# Patient Record
Sex: Male | Born: 1960 | Race: White | Hispanic: No | Marital: Married | Smoking: Never smoker
Health system: Southern US, Community
[De-identification: ages and names within clinical notes are randomized; demographics above are authoritative.]

## PROBLEM LIST (undated history)

## (undated) DIAGNOSIS — I10 Essential (primary) hypertension: Secondary | ICD-10-CM

## (undated) DIAGNOSIS — Z8489 Family history of other specified conditions: Secondary | ICD-10-CM

## (undated) DIAGNOSIS — G4730 Sleep apnea, unspecified: Secondary | ICD-10-CM

## (undated) DIAGNOSIS — F419 Anxiety disorder, unspecified: Secondary | ICD-10-CM

## (undated) DIAGNOSIS — M4316 Spondylolisthesis, lumbar region: Secondary | ICD-10-CM

## (undated) HISTORY — PX: TONSILLECTOMY: SUR1361

## (undated) HISTORY — PX: APPENDECTOMY: SHX54

## (undated) HISTORY — PX: HERNIA REPAIR: SHX51

---

## 2006-07-03 ENCOUNTER — Ambulatory Visit: Payer: Self-pay | Admitting: Otolaryngology

## 2010-09-21 ENCOUNTER — Ambulatory Visit: Payer: Self-pay | Admitting: Gastroenterology

## 2010-09-23 LAB — PATHOLOGY REPORT

## 2013-02-23 ENCOUNTER — Ambulatory Visit: Payer: Self-pay | Admitting: Internal Medicine

## 2013-04-07 ENCOUNTER — Ambulatory Visit: Payer: Self-pay | Admitting: Internal Medicine

## 2013-08-24 IMAGING — US US ABDOMEN LIMITED SLG ORGAN/ASCITES
1 series · 14 of 25 positions shown · non-contrast
Comparison: none

REASON FOR EXAM: liver  elevated LFTs
COMMENTS:

PROCEDURE:     [facility] - GREEN[facility]ALD ABDOMEN LTD 1 ORGAN OR QUAD  - April 07, 2013  [DATE]
RESULT:     History: Elevated liver function tests.
Comparison Study: No prior.

[Series 1: us abdomen limited slg organ/ascites · 0.30mm/px · 14 of 71 slices shown]
[im 1/71]
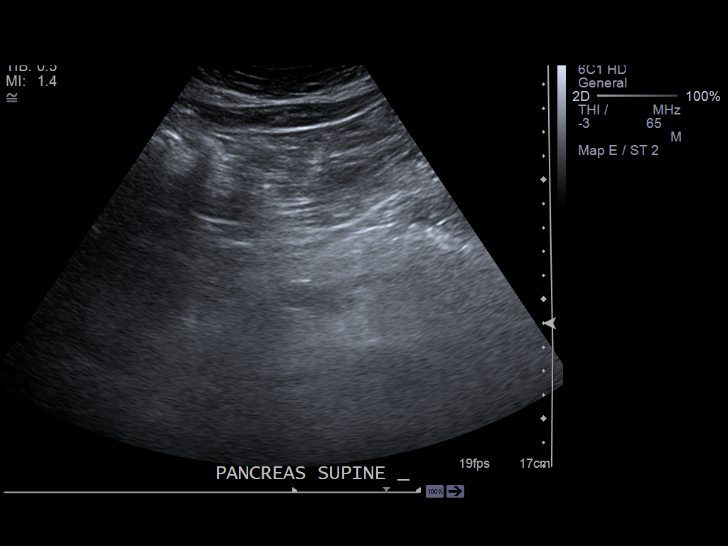
[im 6/71]
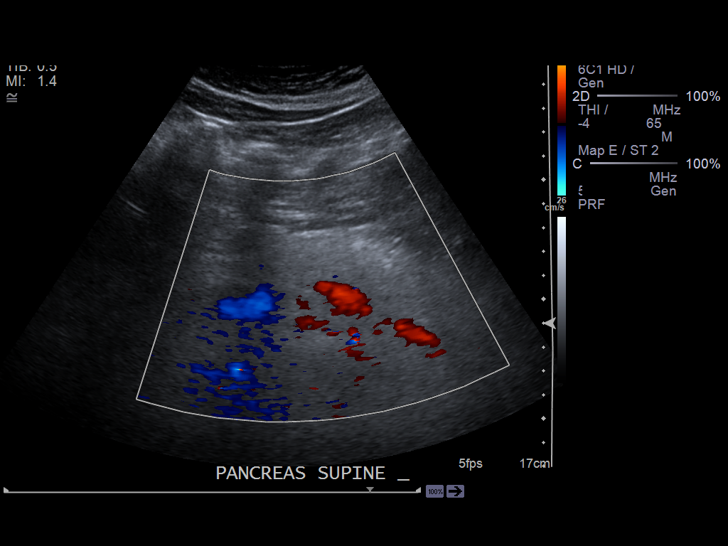
[im 12/71]
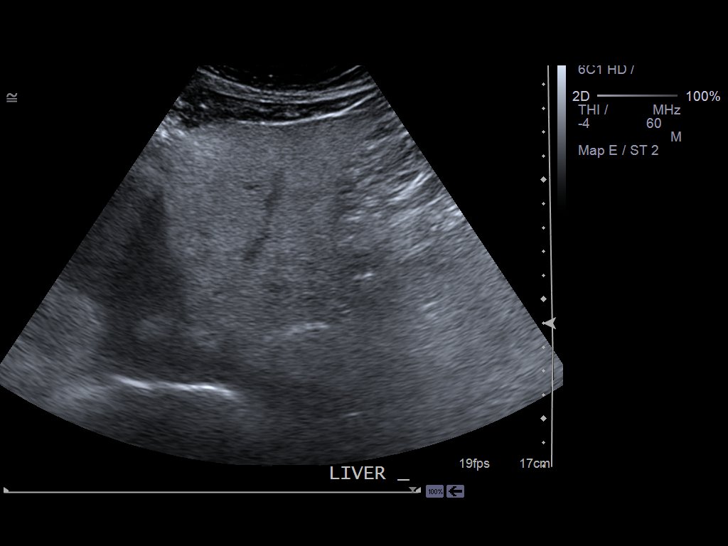
[im 18/71]
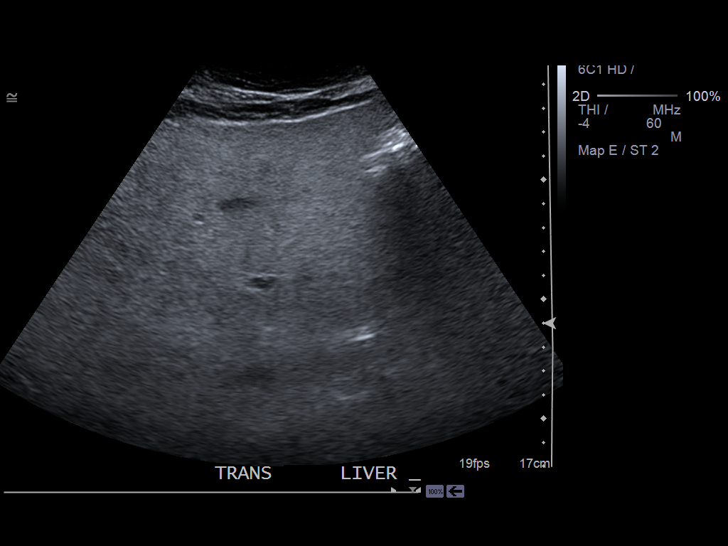
[im 24/71]
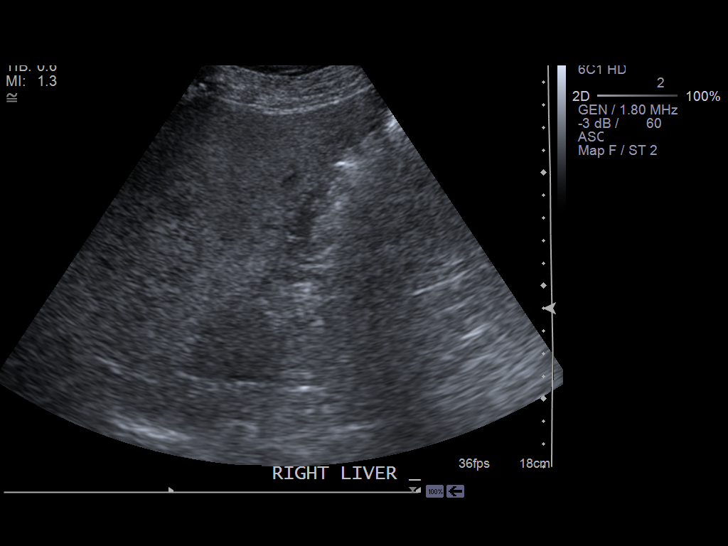
[im 27/71]
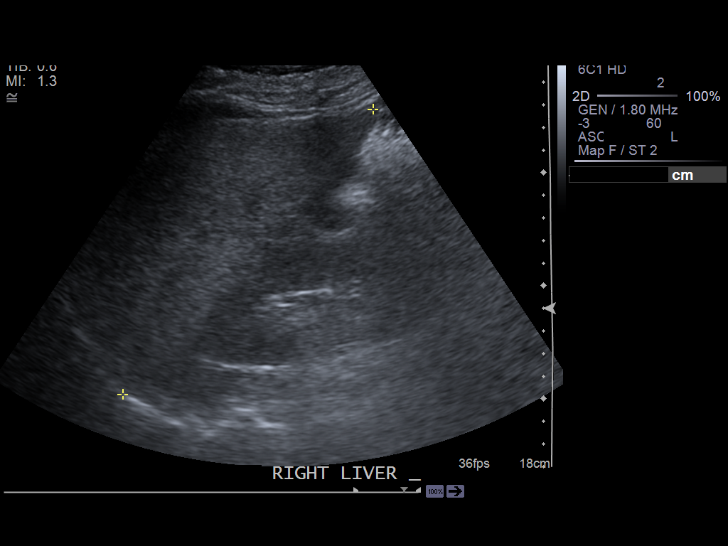
[im 33/71]
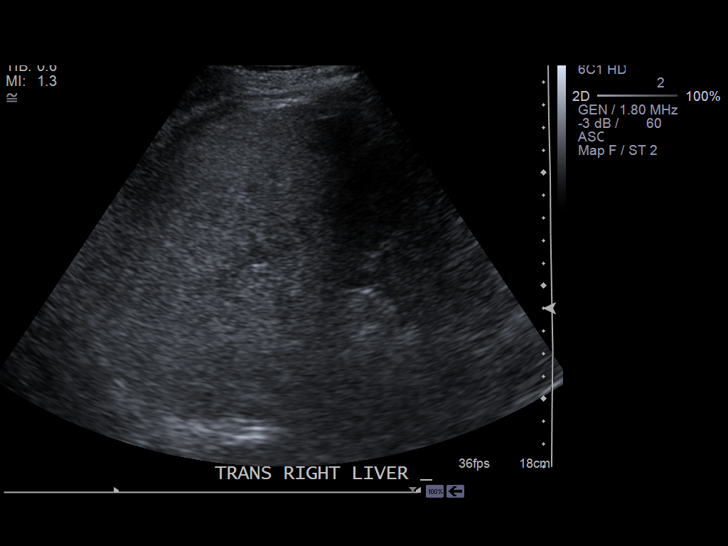
[im 38/71]
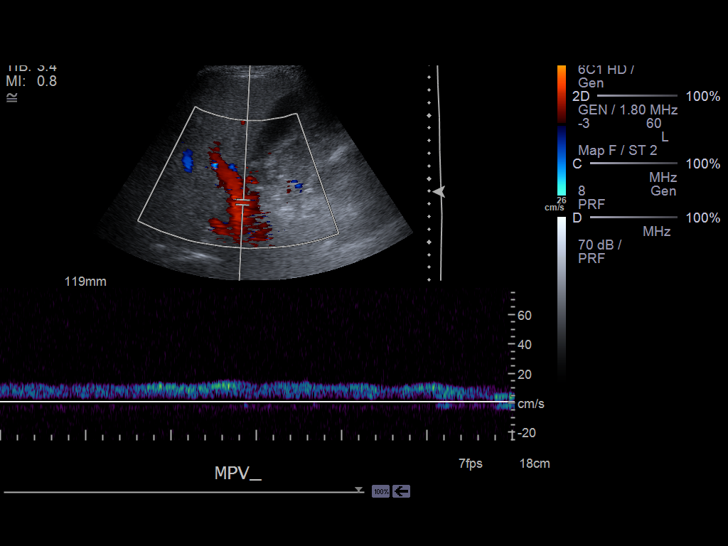
[im 44/71]
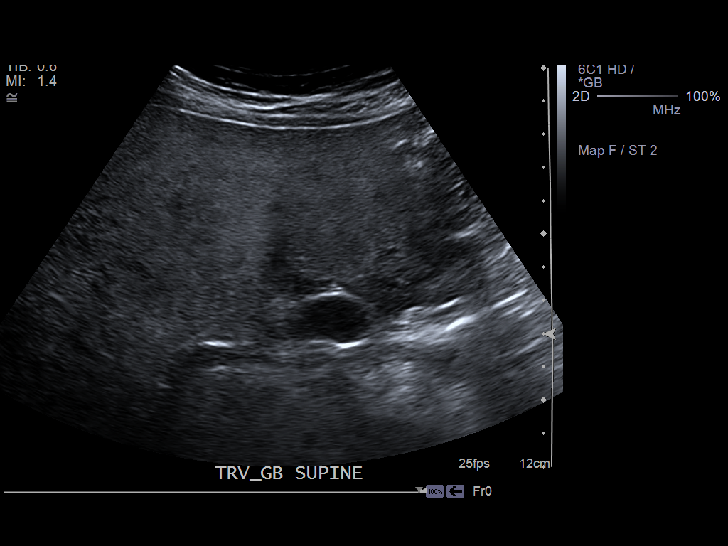
[im 47/71]
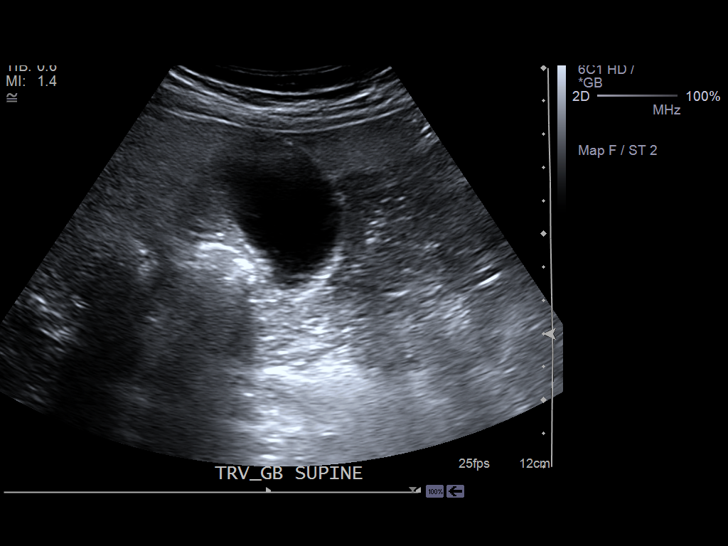
[im 53/71]
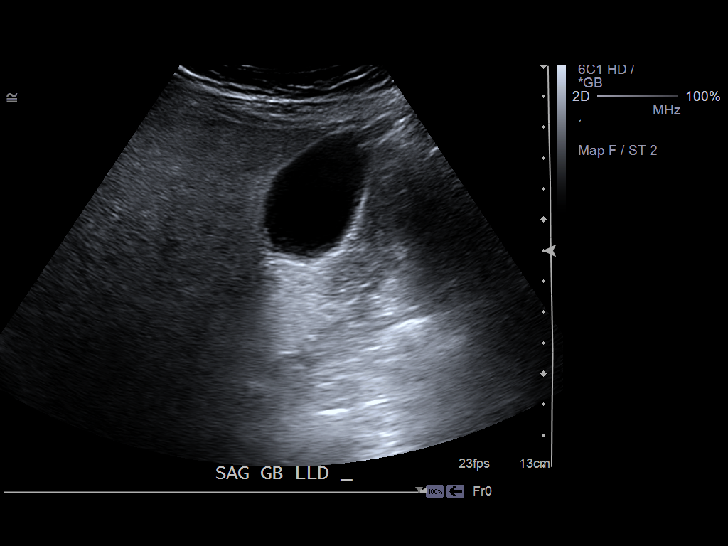
[im 59/71]
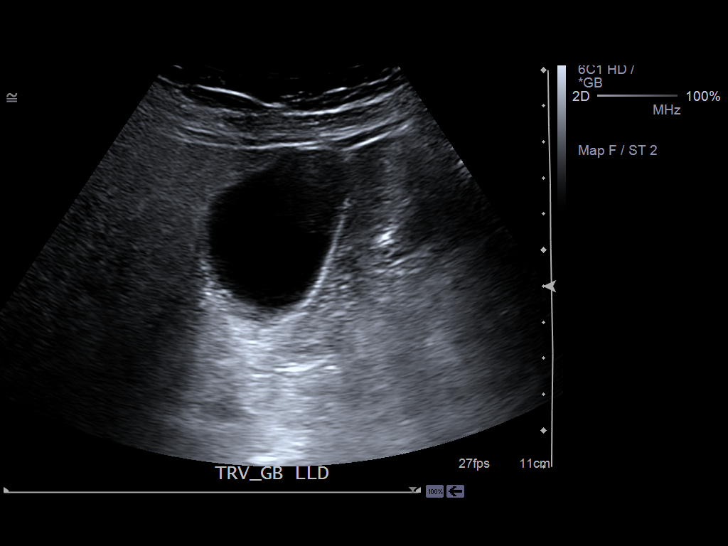
[im 65/71]
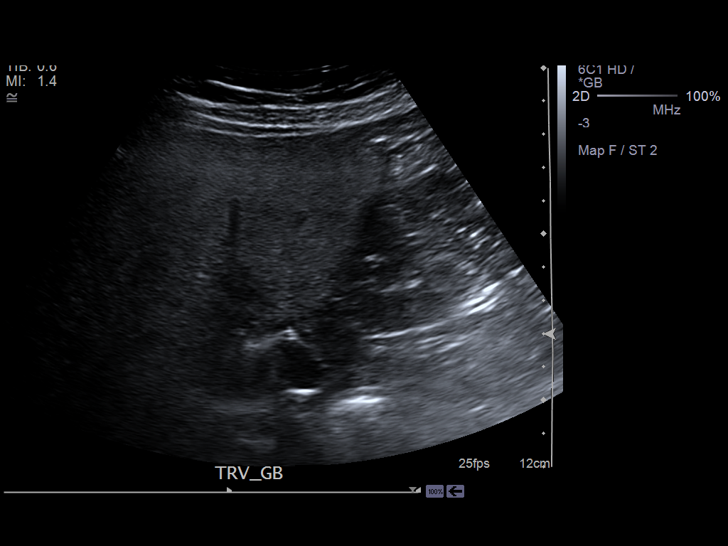
[im 71/71]
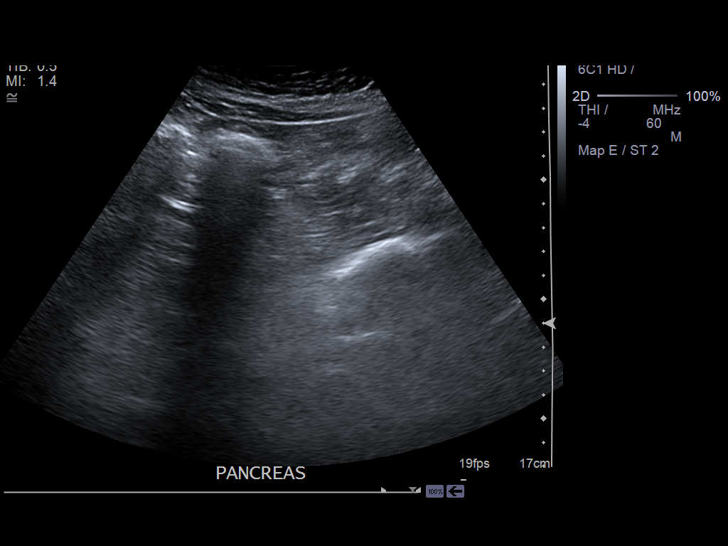

[14 of 25 positions shown; findings below may reference images not displayed]

FINDINGS: Fatty infiltration of the liver present. Portal vein patent.
Pancreas is poorly visualized. Gallbladder normal. Negative Murphy's sign.
Gallbladder wall thickness 1.7 mm, common bile duct caliber 3.2 mm.
IMPRESSION: Fatty infiltration of the liver. No significant biliary
disease.

## 2014-02-02 DIAGNOSIS — G4733 Obstructive sleep apnea (adult) (pediatric): Secondary | ICD-10-CM | POA: Diagnosis present

## 2014-02-02 DIAGNOSIS — M519 Unspecified thoracic, thoracolumbar and lumbosacral intervertebral disc disorder: Secondary | ICD-10-CM | POA: Insufficient documentation

## 2016-06-19 DIAGNOSIS — N401 Enlarged prostate with lower urinary tract symptoms: Secondary | ICD-10-CM | POA: Insufficient documentation

## 2017-05-09 DIAGNOSIS — Z0000 Encounter for general adult medical examination without abnormal findings: Secondary | ICD-10-CM | POA: Diagnosis not present

## 2017-05-16 DIAGNOSIS — Z0000 Encounter for general adult medical examination without abnormal findings: Secondary | ICD-10-CM | POA: Diagnosis not present

## 2017-05-16 DIAGNOSIS — E782 Mixed hyperlipidemia: Secondary | ICD-10-CM | POA: Insufficient documentation

## 2017-06-03 DIAGNOSIS — M5416 Radiculopathy, lumbar region: Secondary | ICD-10-CM | POA: Diagnosis not present

## 2017-06-03 DIAGNOSIS — M48062 Spinal stenosis, lumbar region with neurogenic claudication: Secondary | ICD-10-CM | POA: Diagnosis not present

## 2017-12-04 DIAGNOSIS — N410 Acute prostatitis: Secondary | ICD-10-CM | POA: Diagnosis not present

## 2017-12-04 DIAGNOSIS — R102 Pelvic and perineal pain: Secondary | ICD-10-CM | POA: Diagnosis not present

## 2018-01-13 DIAGNOSIS — M7989 Other specified soft tissue disorders: Secondary | ICD-10-CM | POA: Diagnosis not present

## 2018-01-13 DIAGNOSIS — W1842XA Slipping, tripping and stumbling without falling due to stepping into hole or opening, initial encounter: Secondary | ICD-10-CM | POA: Diagnosis not present

## 2018-01-13 DIAGNOSIS — M25571 Pain in right ankle and joints of right foot: Secondary | ICD-10-CM | POA: Diagnosis not present

## 2018-01-13 DIAGNOSIS — S99911A Unspecified injury of right ankle, initial encounter: Secondary | ICD-10-CM | POA: Diagnosis not present

## 2018-05-15 DIAGNOSIS — Z0000 Encounter for general adult medical examination without abnormal findings: Secondary | ICD-10-CM | POA: Diagnosis not present

## 2018-05-23 DIAGNOSIS — Z0000 Encounter for general adult medical examination without abnormal findings: Secondary | ICD-10-CM | POA: Diagnosis not present

## 2019-08-27 ENCOUNTER — Ambulatory Visit: Payer: 59 | Attending: Internal Medicine

## 2019-08-27 ENCOUNTER — Other Ambulatory Visit: Payer: Self-pay

## 2019-08-27 DIAGNOSIS — Z20822 Contact with and (suspected) exposure to covid-19: Secondary | ICD-10-CM

## 2019-08-28 LAB — NOVEL CORONAVIRUS, NAA: SARS-CoV-2, NAA: NOT DETECTED

## 2020-05-25 ENCOUNTER — Other Ambulatory Visit: Payer: Self-pay | Admitting: Internal Medicine

## 2020-05-25 DIAGNOSIS — R1084 Generalized abdominal pain: Secondary | ICD-10-CM

## 2020-05-26 ENCOUNTER — Other Ambulatory Visit: Payer: Self-pay

## 2020-05-26 ENCOUNTER — Ambulatory Visit
Admission: RE | Admit: 2020-05-26 | Discharge: 2020-05-26 | Disposition: A | Discharge status: Home / Self Care | Payer: 59 | Source: Ambulatory Visit | Attending: Internal Medicine | Admitting: Internal Medicine

## 2020-05-26 DIAGNOSIS — R1084 Generalized abdominal pain: Secondary | ICD-10-CM | POA: Diagnosis present

## 2020-10-12 IMAGING — CT CT ABD-PELV W/O CM
2 of 4 series · 16 of 46 positions shown, 18 images · non-contrast
Comparison: None.

CLINICAL DATA: Acute generalized abdominal pain, diarrhea.

EXAM:
CT ABDOMEN AND PELVIS WITHOUT CONTRAST
TECHNIQUE: Multidetector CT imaging of the abdomen and pelvis was performed
following the standard protocol without IV contrast.

[Series 2: routine abd/pel wo · axial · 0.84mm/px · z∈[-458,-8]mm · 13 of 100 slices shown, 15 images]
[im 5/100  soft-tissue]
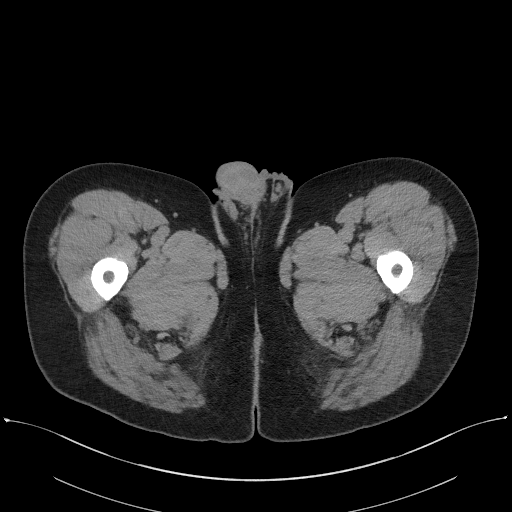
[im 5/100  bone]
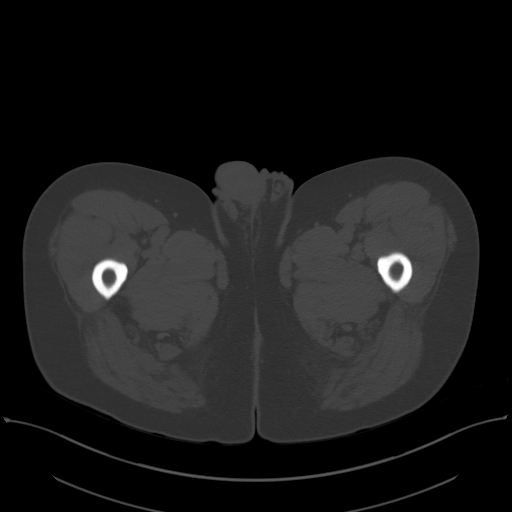
[im 13/100  soft-tissue]
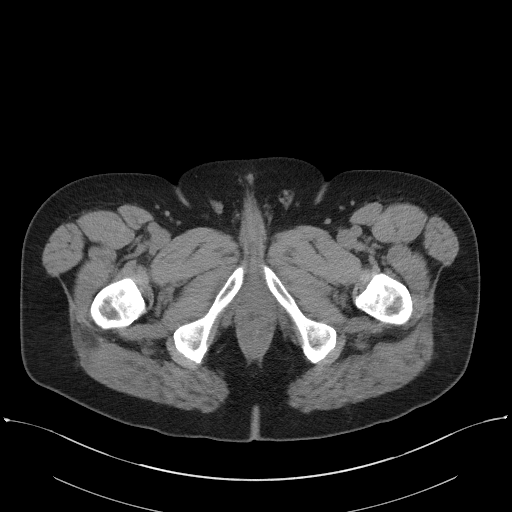
[im 21/100  soft-tissue]
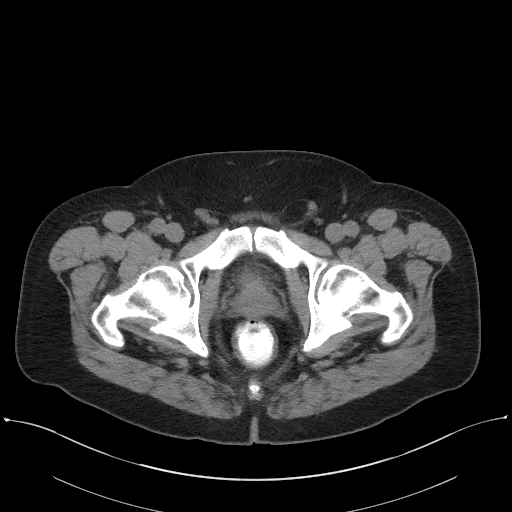
[im 29/100  soft-tissue]
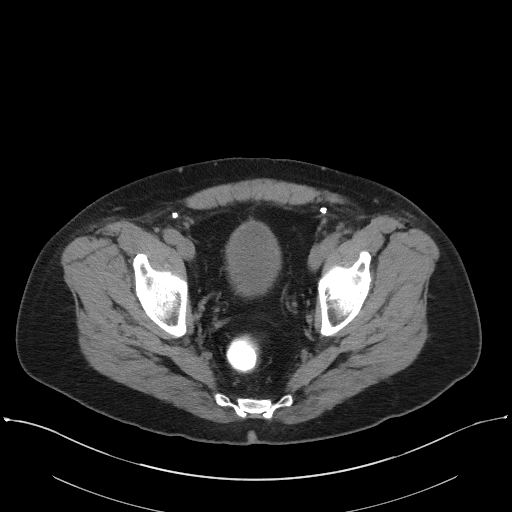
[im 34/100  soft-tissue]
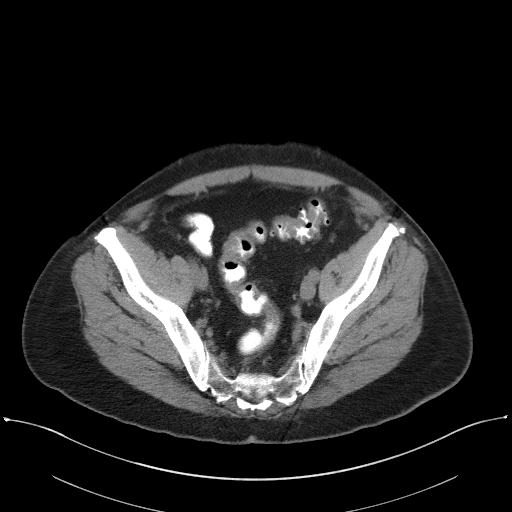
[im 42/100  soft-tissue]
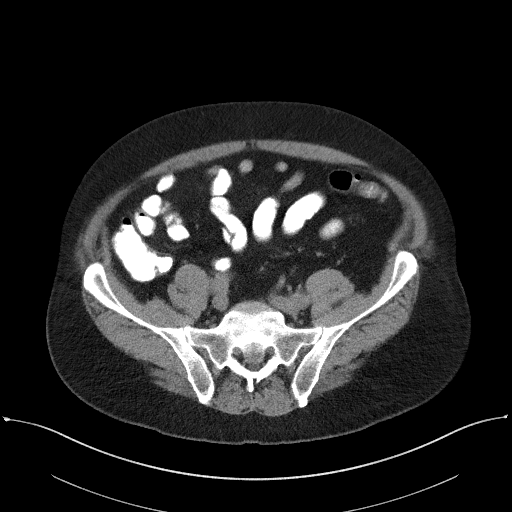
[im 50/100  soft-tissue]
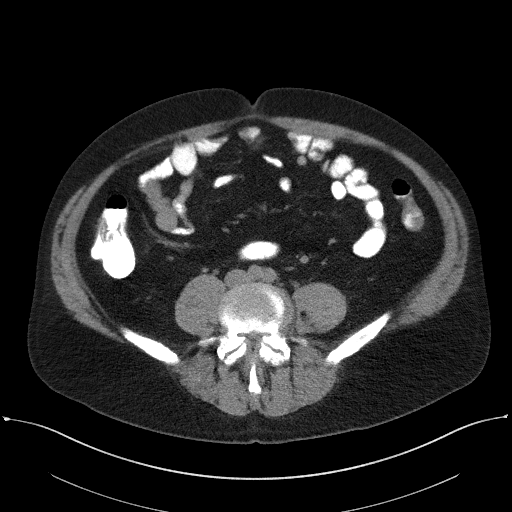
[im 58/100  soft-tissue]
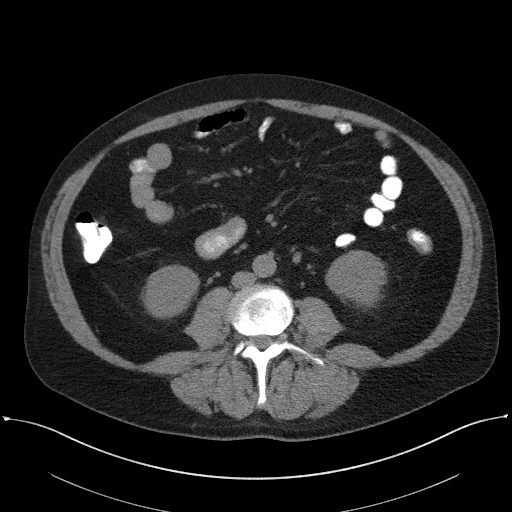
[im 67/100  soft-tissue]
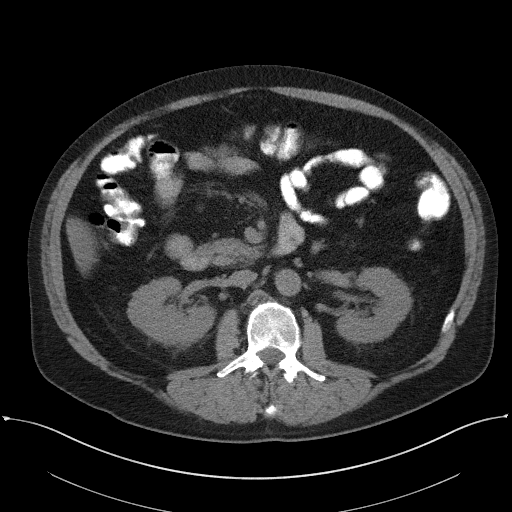
[im 67/100  bone]
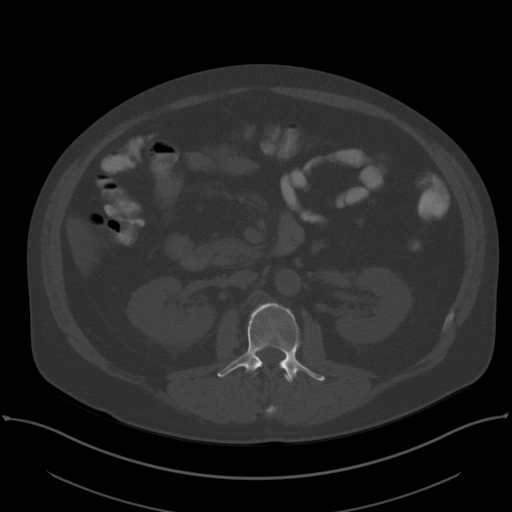
[im 71/100  soft-tissue]
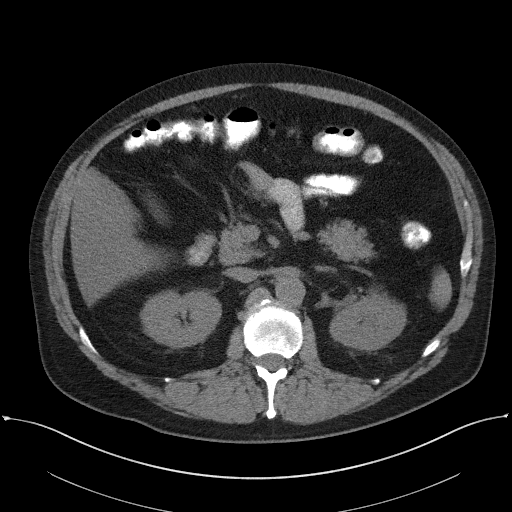
[im 79/100  soft-tissue]
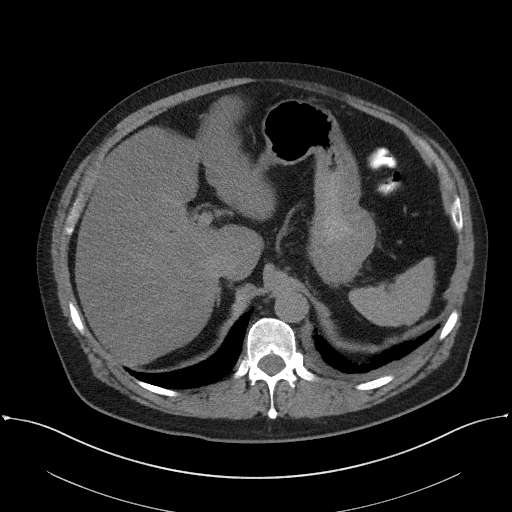
[im 87/100  soft-tissue]
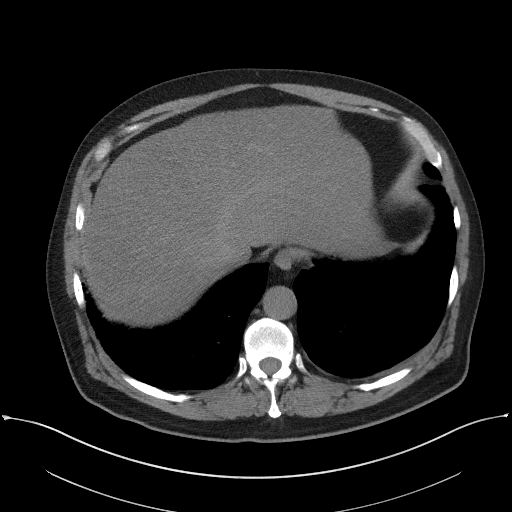
[im 95/100  soft-tissue]
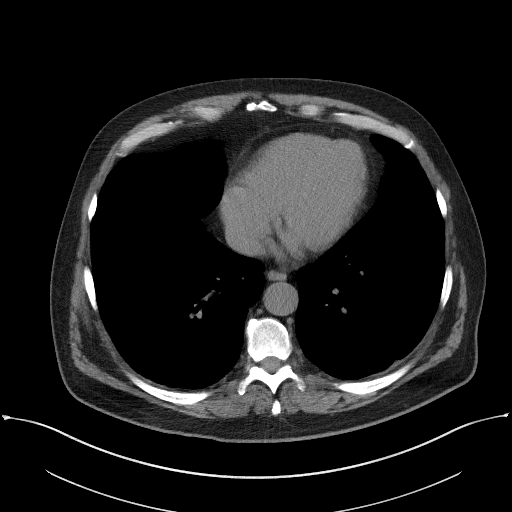

[Series 5: coronal st · coronal · 0.79mm/px · 3 of 108 slices shown]
[im 36/108  soft-tissue]
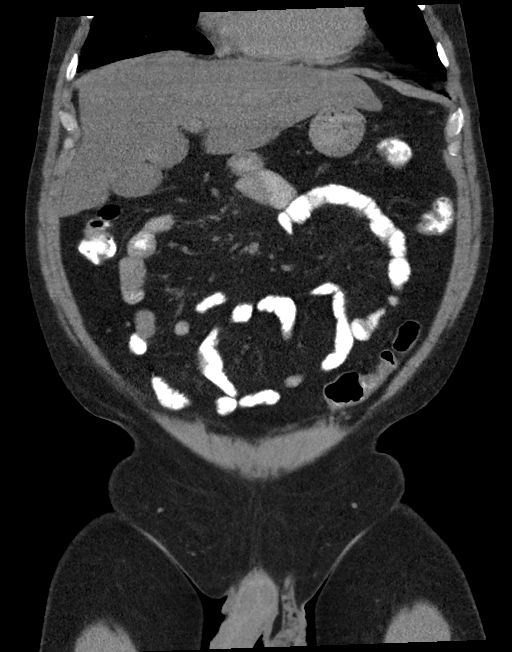
[im 48/108  soft-tissue]
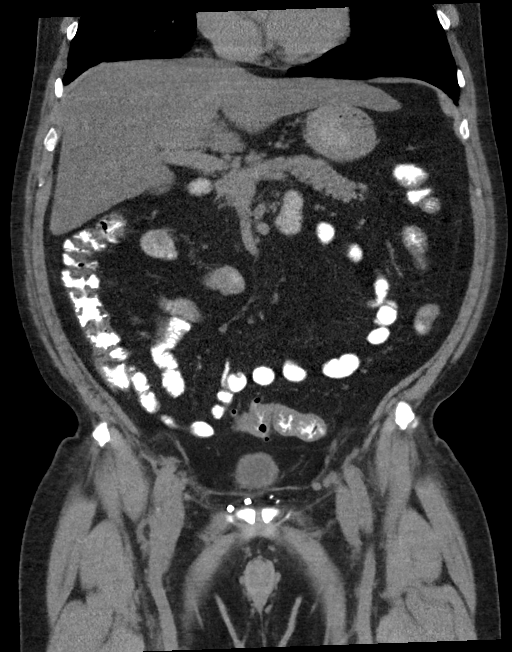
[im 60/108  soft-tissue]
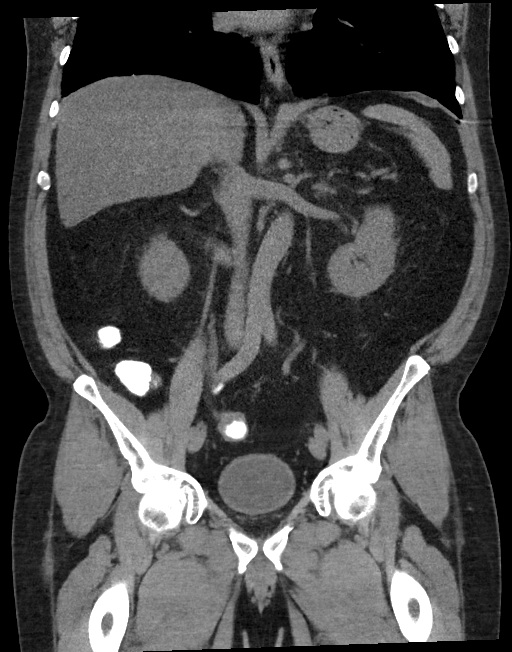

[16 of 46 positions shown; findings below may reference images not displayed]

FINDINGS: Lower chest: No acute abnormality.

Hepatobiliary: No gallstones or biliary dilatation is noted.
Probable hepatic steatosis.

Pancreas: Unremarkable. No pancreatic ductal dilatation or
surrounding inflammatory changes.

Spleen: Normal in size without focal abnormality.

Adrenals/Urinary Tract: Adrenal glands are unremarkable. Kidneys are
normal, without renal calculi, focal lesion, or hydronephrosis.
Bladder is unremarkable.

Stomach/Bowel: The stomach appears normal. There is no evidence of
bowel obstruction. Mild sigmoid diverticulosis is noted without
inflammation. Status post appendectomy.

Vascular/Lymphatic: No significant vascular findings are present. No
enlarged abdominal or pelvic lymph nodes.

Reproductive: Prostate is unremarkable.

Other: No abdominal wall hernia or abnormality. No abdominopelvic
ascites.

Musculoskeletal: No acute or significant osseous findings.
IMPRESSION: 1. Probable hepatic steatosis.
2. Mild sigmoid diverticulosis without inflammation.
3. No acute abnormality seen in the abdomen or pelvis.

## 2022-06-01 DIAGNOSIS — D369 Benign neoplasm, unspecified site: Secondary | ICD-10-CM | POA: Insufficient documentation

## 2022-09-07 ENCOUNTER — Other Ambulatory Visit: Payer: Self-pay | Admitting: Internal Medicine

## 2022-09-07 DIAGNOSIS — M4807 Spinal stenosis, lumbosacral region: Secondary | ICD-10-CM

## 2022-09-28 ENCOUNTER — Other Ambulatory Visit: Payer: 59

## 2022-10-05 ENCOUNTER — Ambulatory Visit
Admission: RE | Admit: 2022-10-05 | Discharge: 2022-10-05 | Disposition: A | Discharge status: Home / Self Care | Payer: 59 | Source: Ambulatory Visit | Attending: Internal Medicine | Admitting: Internal Medicine

## 2022-10-05 DIAGNOSIS — M4807 Spinal stenosis, lumbosacral region: Secondary | ICD-10-CM

## 2022-10-24 ENCOUNTER — Other Ambulatory Visit: Payer: Self-pay | Admitting: Neurosurgery

## 2022-10-29 ENCOUNTER — Other Ambulatory Visit: Payer: Self-pay | Admitting: Neurosurgery

## 2022-11-12 ENCOUNTER — Encounter (HOSPITAL_COMMUNITY): Payer: Self-pay

## 2022-11-12 NOTE — Pre-Procedure Instructions (Signed)
Surgical Instructions    Your procedure is scheduled on Wednesday, March 13th.  Report to [facility] Main Entrance "A" at 06:30 A.M., then check in with the Admitting office.  Call this number if you have problems the morning of surgery:  [phone]  If you have any questions prior to your surgery date call [phone]: Open Monday-Friday 8am-4pm If you experience any cold or flu symptoms such as cough, fever, chills, shortness of breath, etc. between now and your scheduled surgery, please notify us at the above number.     Remember:  Do not eat or drink after midnight the night before your surgery     Take these medicines the morning of surgery with A SIP OF WATER  fluticasone (FLONASE)  PARoxetine (PAXIL)    If needed: ALPRAZolam Duanne Moron)  gabapentin (NEURONTIN)     As of today, STOP taking any Aspirin (unless otherwise instructed by your surgeon) Aleve, Naproxen, Ibuprofen, Motrin, Advil, Goody's, BC's, all herbal medications, fish oil, and all vitamins.                     Do NOT Smoke (Tobacco/Vaping) for 24 hours prior to your procedure.  If you use a CPAP at night, you may bring your mask/headgear for your overnight stay.   Contacts, glasses, piercing's, hearing aid's, dentures or partials may not be worn into surgery, please bring cases for these belongings.    For patients admitted to the hospital, discharge time will be determined by your treatment team.   Patients discharged the day of surgery will not be allowed to drive home, and someone needs to stay with them for 24 hours.  SURGICAL WAITING ROOM VISITATION Patients having surgery or a procedure may have no more than 2 support people in the waiting area - these visitors may rotate.   Children under the age of 88 must have an adult with them who is not the patient. If the patient needs to stay at the hospital during part of their recovery, the visitor guidelines for inpatient rooms apply. Pre-op nurse will  coordinate an appropriate time for 1 support person to accompany patient in pre-op.  This support person may not rotate.   Please refer to the [facility] website for the visitor guidelines for Inpatients (after your surgery is over and you are in a regular room).    Special instructions:   [place]- Preparing For Surgery  Before surgery, you can play an important role. Because skin is not sterile, your skin needs to be as free of germs as possible. You can reduce the number of germs on your skin by washing with CHG (chlorahexidine gluconate) Soap before surgery.  CHG is an antiseptic cleaner which kills germs and bonds with the skin to continue killing germs even after washing.    Oral Hygiene is also important to reduce your risk of infection.  Remember - BRUSH YOUR TEETH THE MORNING OF SURGERY WITH YOUR REGULAR TOOTHPASTE  Please do not use if you have an allergy to CHG or antibacterial soaps. If your skin becomes reddened/irritated stop using the CHG.  Do not shave (including legs and underarms) for at least 48 hours prior to first CHG shower. It is OK to shave your face.  Please follow these instructions carefully.   Shower the NIGHT BEFORE SURGERY and the MORNING OF SURGERY  If you chose to wash your hair, wash your hair first as usual with your normal shampoo.  After you shampoo, rinse your  hair and body thoroughly to remove the shampoo.  Use CHG Soap as you would any other liquid soap. You can apply CHG directly to the skin and wash gently with a scrungie or a clean washcloth.   Apply the CHG Soap to your body ONLY FROM THE NECK DOWN.  Do not use on open wounds or open sores. Avoid contact with your eyes, ears, mouth and genitals (private parts). Wash Face and genitals (private parts)  with your normal soap.   Wash thoroughly, paying special attention to the area where your surgery will be performed.  Thoroughly rinse your body with warm water from the neck down.  DO NOT  shower/wash with your normal soap after using and rinsing off the CHG Soap.  Pat yourself dry with a CLEAN TOWEL.  Wear CLEAN PAJAMAS to bed the night before surgery  Place CLEAN SHEETS on your bed the night before your surgery  DO NOT SLEEP WITH PETS.   Day of Surgery: Take a shower with CHG soap. Do not wear jewelry  Do not wear lotions, powders, colognes, or deodorant. Men may shave face and neck. Do not bring valuables to the hospital. [facility] is not responsible for any belongings or valuables.  Wear Clean/Comfortable clothing the morning of surgery Remember to brush your teeth WITH YOUR REGULAR TOOTHPASTE.   Please read over the following fact sheets that you were given.    If you received a COVID test during your pre-op visit  it is requested that you wear a mask when out in public, stay away from anyone that may not be feeling well and notify your surgeon if you develop symptoms. If you have been in contact with anyone that has tested positive in the last 10 days please notify you surgeon.

## 2022-11-13 ENCOUNTER — Encounter (HOSPITAL_COMMUNITY): Payer: Self-pay

## 2022-11-13 ENCOUNTER — Other Ambulatory Visit: Payer: Self-pay

## 2022-11-13 ENCOUNTER — Encounter (HOSPITAL_COMMUNITY): Payer: Self-pay | Admitting: Physician Assistant

## 2022-11-13 ENCOUNTER — Encounter (HOSPITAL_COMMUNITY)
Admission: RE | Admit: 2022-11-13 | Discharge: 2022-11-13 | Disposition: A | Discharge status: Home / Self Care | Payer: 59 | Source: Ambulatory Visit | Attending: Neurosurgery | Admitting: Neurosurgery

## 2022-11-13 VITALS — BP 129/100 | HR 112 | Temp 98.4°F | Resp 19 | Ht 69.0 in | Wt 203.0 lb

## 2022-11-13 DIAGNOSIS — M4316 Spondylolisthesis, lumbar region: Secondary | ICD-10-CM | POA: Insufficient documentation

## 2022-11-13 DIAGNOSIS — I10 Essential (primary) hypertension: Secondary | ICD-10-CM | POA: Insufficient documentation

## 2022-11-13 DIAGNOSIS — G4733 Obstructive sleep apnea (adult) (pediatric): Secondary | ICD-10-CM | POA: Diagnosis not present

## 2022-11-13 DIAGNOSIS — M48061 Spinal stenosis, lumbar region without neurogenic claudication: Secondary | ICD-10-CM | POA: Insufficient documentation

## 2022-11-13 DIAGNOSIS — E871 Hypo-osmolality and hyponatremia: Secondary | ICD-10-CM | POA: Diagnosis not present

## 2022-11-13 DIAGNOSIS — M47819 Spondylosis without myelopathy or radiculopathy, site unspecified: Secondary | ICD-10-CM | POA: Insufficient documentation

## 2022-11-13 DIAGNOSIS — R000 Tachycardia, unspecified: Secondary | ICD-10-CM | POA: Diagnosis not present

## 2022-11-13 DIAGNOSIS — Z01818 Encounter for other preprocedural examination: Secondary | ICD-10-CM | POA: Insufficient documentation

## 2022-11-13 DIAGNOSIS — F419 Anxiety disorder, unspecified: Secondary | ICD-10-CM | POA: Diagnosis not present

## 2022-11-13 HISTORY — DX: Sleep apnea, unspecified: G47.30

## 2022-11-13 HISTORY — DX: Anxiety disorder, unspecified: F41.9

## 2022-11-13 HISTORY — DX: Spondylolisthesis, lumbar region: M43.16

## 2022-11-13 HISTORY — DX: Family history of other specified conditions: Z84.89

## 2022-11-13 HISTORY — DX: Essential (primary) hypertension: I10

## 2022-11-13 LAB — SURGICAL PCR SCREEN
MRSA, PCR: NEGATIVE
Staphylococcus aureus: NEGATIVE

## 2022-11-13 LAB — BASIC METABOLIC PANEL
Anion gap: 11 (ref 5–15)
BUN: 8 mg/dL (ref 8–23)
CO2: 22 mmol/L (ref 22–32)
Calcium: 8.4 mg/dL — ABNORMAL LOW (ref 8.9–10.3)
Chloride: 96 mmol/L — ABNORMAL LOW (ref 98–111)
Creatinine, Ser: 1.06 mg/dL (ref 0.61–1.24)
GFR, Estimated: >60 mL/min (ref >60)
Glucose, Bld: 101 mg/dL — ABNORMAL HIGH (ref 70–99)
Potassium: 4 mmol/L (ref 3.5–5.1)
Sodium: 129 mmol/L — ABNORMAL LOW (ref 135–145)

## 2022-11-13 LAB — CBC
HCT: 38.7 % — ABNORMAL LOW (ref 39.0–52.0)
Hemoglobin: 13.5 g/dL (ref 13.0–17.0)
MCH: 33.3 pg (ref 26.0–34.0)
MCHC: 34.9 g/dL (ref 30.0–36.0)
MCV: 95.6 fL (ref 80.0–100.0)
Platelets: 196 10*3/uL (ref 150–400)
RBC: 4.05 MIL/uL — ABNORMAL LOW (ref 4.22–5.81)
RDW: 12.2 % (ref 11.5–15.5)
WBC: 5.7 10*3/uL (ref 4.0–10.5)
nRBC: 0 % (ref 0.0–0.2)

## 2022-11-13 LAB — TYPE AND SCREEN
ABO/RH(D): A POS
Antibody Screen: NEGATIVE

## 2022-11-13 NOTE — Progress Notes (Addendum)
PCP - Dr. Emily Filbert Cardiologist - Denies  PPM/ICD - Denies  Chest x-ray - NI EKG - 11/13/22 Stress Test - Denies ECHO - Denies Cardiac Cath - Denies  Sleep Study - Yes has OSA CPAP - Nightly  DM - Denies  COVID TEST- N/A   Anesthesia review: Yes abnormal EKG, B/P elevated (Hadn't taken his Micardis yet)  Patient denies shortness of breath, fever, cough and chest pain at PAT appointment  Patient denies any PNA, Cold, respiratory infection or COVID in the past 2 months.   All instructions explained to the patient, with a verbal understanding of the material. Patient agrees to go over the instructions while at home for a better understanding. The opportunity to ask questions was provided.

## 2022-11-15 NOTE — Anesthesia Preprocedure Evaluation (Deleted)
Anesthesia Evaluation    Airway        Dental   Pulmonary           Cardiovascular hypertension,      Neuro/Psych    GI/Hepatic   Endo/Other    Renal/GU      Musculoskeletal   Abdominal   Peds  Hematology   Anesthesia Other Findings   Reproductive/Obstetrics                             Anesthesia Physical Anesthesia Plan  ASA:   Anesthesia Plan:    Post-op Pain Management:    Induction:   PONV Risk Score and Plan:   Airway Management Planned:   Additional Equipment:   Intra-op Plan:   Post-operative Plan:   Informed Consent:   Plan Discussed with:   Anesthesia Plan Comments: (PAT note written 11/15/2022 by Myra Gianotti, PA-C.  )       Anesthesia Quick Evaluation

## 2022-11-15 NOTE — Progress Notes (Signed)
Anesthesia Chart Review:  Case: [id number] Date/Time: 11/21/22 0815   Procedure: PLIF,IP,POSTERIOR INSTRUMENTATION, L45   Anesthesia type: General   Pre-op diagnosis: SPONDYLOLISTHESIS, LUMBAR REGION   Location: [facility] ROOM 21 / [facility]   Surgeons: Newman Pies, MD       DISCUSSION: Patient is a 62 year old male scheduled for the above procedure.   History includes never smoker, HTN, OSA (uses CPAP), anxiety, appendectomy, tonsillectomy, septoplasty (2003), bilateral hernia repair. Reported his father was slow to wake up after anesthesia.   BP 129/100 in PAT, but he had not taken Micardis-HCT yet. BP 130/90 with HR 101, 135/82 with HR 97 at last two visits with longtime PCP Dr. Sabra Heck. He denied chest pain and SOB per PAT RN interview.   Labs show Na 129, previous low to low normal (133-135 since 05/24/21. He is on Micardis-HCT and Paxil which may be contributing. Will order an ISTAT8 for day of surgery to re-evaluate for stability.   Anesthesia team to evaluate on the day of surgery.    VS: BP (!) 129/100   Pulse (!) 112   Temp 36.9 C   Resp 19   Ht '5\' 9"'$  (1.753 m)   Wt 92.1 kg   SpO2 100%   BMI 29.98 kg/m    PROVIDERS: Rusty Aus, MD is PCP. Annual exam on 06/01/22 with follow-up worsening lumbar radiculitis on 07/25/22. He now had persistent pain and LE numbness with failed response to ESI, NSAIDS, gabapentin, and prednisone. MRI ordered in anticipation for proceeding with surgery.  He has also completed some PT.  LABS: Preoperative labs noted. Na 129, previously 133-135 since 05/24/21 through [facility]. A1c 5.5% 05/24/22 ([facility]) (all labs ordered are listed, but only abnormal results are displayed)  Labs Reviewed  BASIC METABOLIC PANEL - Abnormal; Notable for the following components:      Result Value   Sodium 129 (*)    Chloride 96 (*)    Glucose, Bld 101 (*)    Calcium 8.4 (*)    All other components within normal limits  CBC - Abnormal; Notable for the following  components:   RBC 4.05 (*)    HCT 38.7 (*)    All other components within normal limits  SURGICAL PCR SCREEN  TYPE AND SCREEN     IMAGES: MRI L-spine 10/05/22: IMPRESSION: 1. L4-L5 mild-to-moderate spinal canal stenosis with moderate right and mild left neural foraminal narrowing, which have progressed from the prior exam. Severe facet arthropathy at this level with widening of the facets, which can indicate instability. 2. Narrowing of the lateral recesses at L1-L2 through L4-L5, which could affect the descending L2 through L5 nerve roots, respectively. 3. Multilevel facet arthropathy, which can also be a cause of back pain.    EKG: Sinus tachycardia at 108 bpm Incomplete right bundle branch block Left anterior fascicular block Abnormal ECG No previous ECGs available Confirmed by Lenna Sciara ([id number]) on 11/14/2022 10:27:26 AM  CV: N/A  Past Medical History:  Diagnosis Date   Anxiety    Family history of adverse reaction to anesthesia    FAther had trouble waking   Hypertension    Sleep apnea    Spondylolisthesis at L4-L5 level     Past Surgical History:  Procedure Laterality Date   APPENDECTOMY     HERNIA REPAIR Bilateral    inguinal   TONSILLECTOMY     and adenoids    MEDICATIONS:  ALPRAZolam (XANAX) 0.25 MG tablet   fluticasone (FLONASE)  50 MCG/ACT nasal spray   gabapentin (NEURONTIN) 100 MG capsule   Multiple Vitamin (MULTIVITAMIN WITH MINERALS) TABS tablet   PARoxetine (PAXIL) 30 MG tablet   simvastatin (ZOCOR) 40 MG tablet   telmisartan-hydrochlorothiazide (MICARDIS HCT) 80-12.5 MG tablet   No current facility-administered medications for this encounter.     Myra Gianotti, PA-C Surgical Short Stay/Anesthesiology [facility] Phone [phone] [facility] Phone [phone] 11/15/2022 1:35 PM

## 2022-11-20 ENCOUNTER — Inpatient Hospital Stay: Payer: 59

## 2022-11-20 ENCOUNTER — Other Ambulatory Visit: Payer: Self-pay

## 2022-11-20 ENCOUNTER — Inpatient Hospital Stay
Admission: EM | Admit: 2022-11-20 | Discharge: 2022-12-02 | DRG: 439 | Disposition: A | Discharge status: Home / Self Care | Payer: 59 | Attending: Internal Medicine | Admitting: Internal Medicine

## 2022-11-20 DIAGNOSIS — K219 Gastro-esophageal reflux disease without esophagitis: Secondary | ICD-10-CM | POA: Diagnosis present

## 2022-11-20 DIAGNOSIS — N179 Acute kidney failure, unspecified: Principal | ICD-10-CM | POA: Diagnosis present

## 2022-11-20 DIAGNOSIS — Z79899 Other long term (current) drug therapy: Secondary | ICD-10-CM

## 2022-11-20 DIAGNOSIS — Z1152 Encounter for screening for COVID-19: Secondary | ICD-10-CM | POA: Diagnosis not present

## 2022-11-20 DIAGNOSIS — E871 Hypo-osmolality and hyponatremia: Secondary | ICD-10-CM | POA: Diagnosis present

## 2022-11-20 DIAGNOSIS — G4733 Obstructive sleep apnea (adult) (pediatric): Secondary | ICD-10-CM | POA: Diagnosis present

## 2022-11-20 DIAGNOSIS — R140 Abdominal distension (gaseous): Secondary | ICD-10-CM | POA: Diagnosis not present

## 2022-11-20 DIAGNOSIS — E785 Hyperlipidemia, unspecified: Secondary | ICD-10-CM | POA: Diagnosis present

## 2022-11-20 DIAGNOSIS — K5790 Diverticulosis of intestine, part unspecified, without perforation or abscess without bleeding: Secondary | ICD-10-CM | POA: Diagnosis present

## 2022-11-20 DIAGNOSIS — D72829 Elevated white blood cell count, unspecified: Secondary | ICD-10-CM | POA: Diagnosis present

## 2022-11-20 DIAGNOSIS — Z6833 Body mass index (BMI) 33.0-33.9, adult: Secondary | ICD-10-CM | POA: Diagnosis not present

## 2022-11-20 DIAGNOSIS — I959 Hypotension, unspecified: Secondary | ICD-10-CM | POA: Diagnosis present

## 2022-11-20 DIAGNOSIS — M549 Dorsalgia, unspecified: Secondary | ICD-10-CM | POA: Diagnosis present

## 2022-11-20 DIAGNOSIS — E861 Hypovolemia: Secondary | ICD-10-CM | POA: Diagnosis present

## 2022-11-20 DIAGNOSIS — R809 Proteinuria, unspecified: Secondary | ICD-10-CM | POA: Diagnosis present

## 2022-11-20 DIAGNOSIS — R109 Unspecified abdominal pain: Secondary | ICD-10-CM | POA: Diagnosis present

## 2022-11-20 DIAGNOSIS — F419 Anxiety disorder, unspecified: Secondary | ICD-10-CM | POA: Diagnosis present

## 2022-11-20 DIAGNOSIS — R110 Nausea: Secondary | ICD-10-CM

## 2022-11-20 DIAGNOSIS — E8770 Fluid overload, unspecified: Secondary | ICD-10-CM | POA: Diagnosis present

## 2022-11-20 DIAGNOSIS — E669 Obesity, unspecified: Secondary | ICD-10-CM | POA: Diagnosis present

## 2022-11-20 DIAGNOSIS — R319 Hematuria, unspecified: Secondary | ICD-10-CM | POA: Diagnosis present

## 2022-11-20 DIAGNOSIS — E860 Dehydration: Secondary | ICD-10-CM | POA: Diagnosis present

## 2022-11-20 DIAGNOSIS — G8929 Other chronic pain: Secondary | ICD-10-CM | POA: Diagnosis present

## 2022-11-20 DIAGNOSIS — K8591 Acute pancreatitis with uninfected necrosis, unspecified: Secondary | ICD-10-CM | POA: Diagnosis present

## 2022-11-20 DIAGNOSIS — R202 Paresthesia of skin: Secondary | ICD-10-CM | POA: Diagnosis present

## 2022-11-20 DIAGNOSIS — K859 Acute pancreatitis without necrosis or infection, unspecified: Secondary | ICD-10-CM

## 2022-11-20 DIAGNOSIS — E876 Hypokalemia: Secondary | ICD-10-CM | POA: Diagnosis present

## 2022-11-20 DIAGNOSIS — I10 Essential (primary) hypertension: Secondary | ICD-10-CM | POA: Diagnosis present

## 2022-11-20 DIAGNOSIS — K567 Ileus, unspecified: Secondary | ICD-10-CM | POA: Diagnosis not present

## 2022-11-20 DIAGNOSIS — M4316 Spondylolisthesis, lumbar region: Secondary | ICD-10-CM | POA: Diagnosis present

## 2022-11-20 LAB — BASIC METABOLIC PANEL WITH GFR
Anion gap: 13 (ref 5–15)
BUN: 61 mg/dL — ABNORMAL HIGH (ref 8–23)
CO2: 16 mmol/L — ABNORMAL LOW (ref 22–32)
Calcium: 4.7 mg/dL — CL (ref 8.9–10.3)
Chloride: 87 mmol/L — ABNORMAL LOW (ref 98–111)
Creatinine, Ser: 6.78 mg/dL — ABNORMAL HIGH (ref 0.61–1.24)
GFR, Estimated: 9 mL/min — ABNORMAL LOW
Glucose, Bld: 88 mg/dL (ref 70–99)
Potassium: 4.1 mmol/L (ref 3.5–5.1)
Sodium: 116 mmol/L — CL (ref 135–145)

## 2022-11-20 LAB — URINALYSIS, ROUTINE W REFLEX MICROSCOPIC
Bacteria, UA: NONE SEEN
Bilirubin Urine: NEGATIVE
Glucose, UA: 50 mg/dL — AB
Ketones, ur: NEGATIVE mg/dL
Leukocytes,Ua: NEGATIVE
Nitrite: NEGATIVE
Protein, ur: 100 mg/dL — AB
Specific Gravity, Urine: 1.011 (ref 1.005–1.030)
pH: 5 (ref 5.0–8.0)

## 2022-11-20 LAB — MAGNESIUM: Magnesium: 1.5 mg/dL — ABNORMAL LOW (ref 1.7–2.4)

## 2022-11-20 LAB — LACTIC ACID, PLASMA: Lactic Acid, Venous: 1.4 mmol/L (ref 0.5–1.9)

## 2022-11-20 LAB — COMPREHENSIVE METABOLIC PANEL
ALT: 25 U/L (ref 0–44)
AST: 44 U/L — ABNORMAL HIGH (ref 15–41)
Albumin: 3.2 g/dL — ABNORMAL LOW (ref 3.5–5.0)
Alkaline Phosphatase: 86 U/L (ref 38–126)
Anion gap: 16 — ABNORMAL HIGH (ref 5–15)
BUN: 66 mg/dL — ABNORMAL HIGH (ref 8–23)
CO2: 16 mmol/L — ABNORMAL LOW (ref 22–32)
Calcium: 4.9 mg/dL — CL (ref 8.9–10.3)
Chloride: 81 mmol/L — ABNORMAL LOW (ref 98–111)
Creatinine, Ser: 7.82 mg/dL — ABNORMAL HIGH (ref 0.61–1.24)
GFR, Estimated: 7 mL/min — ABNORMAL LOW (ref >60)
Glucose, Bld: 113 mg/dL — ABNORMAL HIGH (ref 70–99)
Potassium: 4.9 mmol/L (ref 3.5–5.1)
Sodium: 113 mmol/L — CL (ref 135–145)
Total Bilirubin: 1.1 mg/dL (ref 0.3–1.2)
Total Protein: 6.9 g/dL (ref 6.5–8.1)

## 2022-11-20 LAB — CBC
HCT: 35.9 % — ABNORMAL LOW (ref 39.0–52.0)
Hemoglobin: 12.7 g/dL — ABNORMAL LOW (ref 13.0–17.0)
MCH: 33.8 pg (ref 26.0–34.0)
MCHC: 35.4 g/dL (ref 30.0–36.0)
MCV: 95.5 fL (ref 80.0–100.0)
Platelets: 139 10*3/uL — ABNORMAL LOW (ref 150–400)
RBC: 3.76 MIL/uL — ABNORMAL LOW (ref 4.22–5.81)
RDW: 13.6 % (ref 11.5–15.5)
WBC: 14.5 10*3/uL — ABNORMAL HIGH (ref 4.0–10.5)
nRBC: 0 % (ref 0.0–0.2)

## 2022-11-20 LAB — LIPID PANEL
Cholesterol: 107 mg/dL (ref 0–200)
HDL: 29 mg/dL — ABNORMAL LOW
LDL Cholesterol: 38 mg/dL (ref 0–99)
Total CHOL/HDL Ratio: 3.7 ratio
Triglycerides: 199 mg/dL — ABNORMAL HIGH
VLDL: 40 mg/dL (ref 0–40)

## 2022-11-20 LAB — BASIC METABOLIC PANEL
Anion gap: 13 (ref 5–15)
BUN: 63 mg/dL — ABNORMAL HIGH (ref 8–23)
CO2: 16 mmol/L — ABNORMAL LOW (ref 22–32)
Calcium: 4.4 mg/dL — CL (ref 8.9–10.3)
Chloride: 87 mmol/L — ABNORMAL LOW (ref 98–111)
Creatinine, Ser: 7.19 mg/dL — ABNORMAL HIGH (ref 0.61–1.24)
GFR, Estimated: 8 mL/min — ABNORMAL LOW (ref >60)
Glucose, Bld: 101 mg/dL — ABNORMAL HIGH (ref 70–99)
Potassium: 4.2 mmol/L (ref 3.5–5.1)
Sodium: 116 mmol/L — CL (ref 135–145)

## 2022-11-20 LAB — OSMOLALITY, URINE: Osmolality, Ur: 249 mOsm/kg — ABNORMAL LOW (ref 300–900)

## 2022-11-20 LAB — SODIUM, URINE, RANDOM: Sodium, Ur: 11 mmol/L

## 2022-11-20 LAB — RESP PANEL BY RT-PCR (RSV, FLU A&B, COVID)  RVPGX2
Influenza A by PCR: NEGATIVE
Influenza B by PCR: NEGATIVE
Resp Syncytial Virus by PCR: NEGATIVE
SARS Coronavirus 2 by RT PCR: NEGATIVE

## 2022-11-20 LAB — LIPASE, BLOOD: Lipase: 185 U/L — ABNORMAL HIGH (ref 11–51)

## 2022-11-20 MED ORDER — MAGNESIUM SULFATE 2 GM/50ML IV SOLN
2.0000 g | Freq: Once | INTRAVENOUS | Status: AC
  Administered 2022-11-20: 2 g via INTRAVENOUS
  Filled 2022-11-20: qty 50

## 2022-11-20 MED ORDER — ACETAMINOPHEN 650 MG RE SUPP: 650.0000 mg | Freq: Four times a day (QID) | RECTAL | Status: DC | PRN

## 2022-11-20 MED ORDER — ALPRAZOLAM 0.25 MG PO TABS
0.2500 mg | ORAL_TABLET | Freq: Every day | ORAL | Status: DC | PRN
  Administered 2022-11-21 – 2022-11-24 (×3): 0.25 mg via ORAL
  Filled 2022-11-20 (×3): qty 1

## 2022-11-20 MED ORDER — ONDANSETRON HCL 4 MG/2ML IJ SOLN
4.0000 mg | Freq: Four times a day (QID) | INTRAMUSCULAR | Status: DC | PRN
  Administered 2022-11-23 (×2): 4 mg via INTRAVENOUS
  Filled 2022-11-20 (×2): qty 2

## 2022-11-20 MED ORDER — SODIUM CHLORIDE 0.9 % IV BOLUS
1000.0000 mL | Freq: Once | INTRAVENOUS | Status: AC
  Administered 2022-11-20: 1000 mL via INTRAVENOUS

## 2022-11-20 MED ORDER — CALCIUM GLUCONATE-NACL 1-0.675 GM/50ML-% IV SOLN
1.0000 g | Freq: Once | INTRAVENOUS | Status: AC
  Administered 2022-11-20: 1000 mg via INTRAVENOUS
  Filled 2022-11-20: qty 50

## 2022-11-20 MED ORDER — HEPARIN SODIUM (PORCINE) 5000 UNIT/ML IJ SOLN
5000.0000 [IU] | Freq: Three times a day (TID) | INTRAMUSCULAR | Status: DC
  Administered 2022-11-20 – 2022-11-27 (×19): 5000 [IU] via SUBCUTANEOUS
  Filled 2022-11-20 (×21): qty 1

## 2022-11-20 MED ORDER — PAROXETINE HCL 30 MG PO TABS
30.0000 mg | ORAL_TABLET | Freq: Every day | ORAL | Status: DC
  Administered 2022-11-21 – 2022-12-02 (×12): 30 mg via ORAL
  Filled 2022-11-20 (×13): qty 1

## 2022-11-20 MED ORDER — LACTATED RINGERS IV SOLN: INTRAVENOUS | Status: DC

## 2022-11-20 MED ORDER — ACETAMINOPHEN 325 MG PO TABS
650.0000 mg | ORAL_TABLET | Freq: Four times a day (QID) | ORAL | Status: DC | PRN
  Administered 2022-11-21 – 2022-11-24 (×4): 650 mg via ORAL
  Filled 2022-11-20 (×4): qty 2

## 2022-11-20 MED ORDER — CALCIUM GLUCONATE-NACL 2-0.675 GM/100ML-% IV SOLN
2.0000 g | Freq: Once | INTRAVENOUS | Status: AC
  Administered 2022-11-20: 2000 mg via INTRAVENOUS
  Filled 2022-11-20: qty 100

## 2022-11-20 MED ORDER — ONDANSETRON HCL 4 MG PO TABS: 4.0000 mg | ORAL_TABLET | Freq: Four times a day (QID) | ORAL | Status: DC | PRN

## 2022-11-20 MED ORDER — SODIUM CHLORIDE 0.9% FLUSH
3.0000 mL | Freq: Two times a day (BID) | INTRAVENOUS | Status: DC
  Administered 2022-11-20 – 2022-12-02 (×21): 3 mL via INTRAVENOUS

## 2022-11-20 NOTE — Assessment & Plan Note (Addendum)
Likely secondary to pancreatitis.  No EKG changes or tetany on exam. --s/p IV cal gluconate x15 g.  Ca persistently around 7's Plan: --hold further IV Ca suppl --cont oral Ca suppl

## 2022-11-20 NOTE — Assessment & Plan Note (Addendum)
Na 113 on presentation.  In the setting of profound dehydration and acute pancreatitis.  Despite renal recovery and continuous IVF since presentation, sodium remained around 120. 3/20: Na 120 >> 126 on 3% saline 3/21: Na 129, stopped 3% saline, cont salt tabs 3/22: Na 132 3/23: Na down 125. Stopped salt tabs. Started on PO Lasix 20 mg BID per nephrology 3/24: Na 125 >>127.  Given tolvaptan --Mgmt per Nephrology - agreeable with d/c and very close outpatient follow up --Close Nephrology follow --Off salt tabs due to progressive hypervolemia --Started on Lasix 20 mg PO BID --BMP in follow up by mid-week --Daily weights  to monitor volume status --Stopped HCTZ

## 2022-11-20 NOTE — Assessment & Plan Note (Addendum)
-   Continue home paroxetine and Xanax as needed

## 2022-11-20 NOTE — ED Provider Notes (Signed)
[facility] Provider Note    Event Date/Time   First MD Initiated Contact with Patient 11/20/22 1136     (approximate)   History   Abdominal Pain   HPI  Arthur Patterson is a 62 y.o. male  who presents to the emergency department today because of concern for abdominal pain, nausea and near syncope. The patient's symptoms started 3 days ago. He states he has had many episodes of vomiting. He has tried taking oral liquids, including pedialyte without any significant ability to take in fluids. Passed out yesterday and today felt weak and lightheaded. Also having some tingling in his extremities. The patient denies any known sick contacts.       Physical Exam   Triage Vital Signs: ED Triage Vitals  Enc Vitals Group     BP 11/20/22 1126 (!) 73/53     Pulse Rate 11/20/22 1126 (!) 101     Resp 11/20/22 1126 (!) 21     Temp 11/20/22 1126 98.2 F (36.8 C)     Temp Source 11/20/22 1126 Oral     SpO2 11/20/22 1126 95 %     Weight 11/20/22 1127 206 lb (93.4 kg)     Height --      Head Circumference --      Peak Flow --      Pain Score 11/20/22 1127 0     Pain Loc --      Pain Edu? --      Excl. in [place]? --     Most recent vital signs: Vitals:   11/20/22 1126 11/20/22 1140  BP: (!) 73/53 (!) 85/58  Pulse: (!) 101 91  Resp: (!) 21 (!) 27  Temp: 98.2 F (36.8 C)   SpO2: 95% 93%   General: Awake, alert, oriented. CV:  Good peripheral perfusion. Regular rate and rhythm. Resp:  Normal effort. Lungs clear. Abd:  No distention. Minimally diffusely tender to palpation.    ED Results / Procedures / Treatments   Labs (all labs ordered are listed, but only abnormal results are displayed) Labs Reviewed  LIPASE, BLOOD - Abnormal; Notable for the following components:      Result Value   Lipase 185 (*)    All other components within normal limits  COMPREHENSIVE METABOLIC PANEL - Abnormal; Notable for the following components:   Sodium 113 (*)     Chloride 81 (*)    CO2 16 (*)    Glucose, Bld 113 (*)    BUN 66 (*)    Creatinine, Ser 7.82 (*)    Calcium 4.9 (*)    Albumin 3.2 (*)    AST 44 (*)    GFR, Estimated 7 (*)    Anion gap 16 (*)    All other components within normal limits  CBC - Abnormal; Notable for the following components:   WBC 14.5 (*)    RBC 3.76 (*)    Hemoglobin 12.7 (*)    HCT 35.9 (*)    Platelets 139 (*)    All other components within normal limits  RESP PANEL BY RT-PCR (RSV, FLU A&B, COVID)  [id number]  URINALYSIS, ROUTINE W REFLEX MICROSCOPIC  BASIC METABOLIC PANEL     EKG  I, Nance Pear, attending physician, personally viewed and interpreted this EKG  EKG Time: 1129 Rate: 100 Rhythm: normal sinus rhythm Axis: left axis deviation Intervals: qtc 466 QRS: narrow ST changes: no st elevation Impression: abnormal ekg    RADIOLOGY None  PROCEDURES:  Critical Care performed: Yes  CRITICAL CARE Performed by: Nance Pear   Total critical care time: 30 minutes  Critical care time was exclusive of separately billable procedures and treating other patients.  Critical care was necessary to treat or prevent imminent or life-threatening deterioration.  Critical care was time spent personally by me on the following activities: development of treatment plan with patient and/or surrogate as well as nursing, discussions with consultants, evaluation of patient's response to treatment, examination of patient, obtaining history from patient or surrogate, ordering and performing treatments and interventions, ordering and review of laboratory studies, ordering and review of radiographic studies, pulse oximetry and re-evaluation of patient's condition.   Procedures    MEDICATIONS ORDERED IN ED: Medications - No data to display   IMPRESSION / MDM / [employer] / ED COURSE  I reviewed the triage vital signs and the nursing notes.                              Differential  diagnosis includes, but is not limited to, dehydration, viral gastroenteritis, pancreatitis, hepatitis.  Patient's presentation is most consistent with acute presentation with potential threat to life or bodily function.  The patient is on the cardiac monitor to evaluate for evidence of arrhythmia and/or significant heart rate changes.  Patient presented to the emergency department today because of concerns for dehydration in the setting of nausea vomiting.  On exam patient's initial blood pressure was low.  Had some mild diffuse abdominal tenderness.  Patient was started on IV fluids for presumed dehydration.  Blood work is concerning for dehydration with AKI.  Additionally electrolyte abnormalities with hypokalemia and hypocalcemia.  I discussed these findings with the patient.  Will continue IV fluids and start IV calcium.  Patient was kept on a cardiac monitor.  Discussed with Dr. Charleen Kirks with the hospitalist service who will plan on admission.      FINAL CLINICAL IMPRESSION(S) / ED DIAGNOSES   Final diagnoses:  AKI (acute kidney injury) ([place])  Nausea  Hypocalcemia    Note:  This document was prepared using Dragon voice recognition software and may include unintentional dictation errors.    Nance Pear, MD 11/20/22 1320

## 2022-11-20 NOTE — Assessment & Plan Note (Signed)
-   CPAP at bedtime 

## 2022-11-20 NOTE — Progress Notes (Signed)
CPAP DECLINED BY PT

## 2022-11-20 NOTE — Assessment & Plan Note (Addendum)
Patient presenting with nausea and vomiting for 3 to 4 days in the setting of elevated lipase and evidence of markedly inflamed pancreas on CT imaging.  Etiology uncertain at this time as patient declines any recent alcohol use and no gallstones or gallbladder thickening seen on CT imaging and no biliary dilation.  Triglycerides 199 on presentation.  Only medication he takes that is known to cause pancreatitis is simvastatin. --s/p aggressive IVF. --due to worsening leukocytosis, repeat CT a/p obtained on 3/15 which showed "Acute necrotizing pancreatitis with an approximately 3 cm area of non enhancement involving the pancreatic body."  Since no signs of infection currently, no need for ppx abx, agreed with curbside GI. Plan: --If fever develops, then obtain blood cx and start meropenem. --repeat CT scan in 4 weeks

## 2022-11-20 NOTE — Assessment & Plan Note (Addendum)
Patient presenting with creatinine of 7.8, previously within normal limits.  Most likely secondary to significant hypovolemia in the setting of acute pancreatitis, and N/V/D.  Last BMP obtained 1 week ago prior to symptom onset with creatinine of 1.06.  - Nephrology consulted --Cr normalized with IV hydration

## 2022-11-20 NOTE — ED Notes (Signed)
Dr Archie Balboa given critical results, NA+ and Ca

## 2022-11-20 NOTE — H&P (Signed)
History and Physical    Patient: Arthur Patterson [id number] DOB: July 16, 1961 DOA: 11/20/2022 DOS: the patient was seen and examined on 11/20/2022 PCP: Rusty Aus, MD  Patient coming from: Home  Chief Complaint:  Chief Complaint  Patient presents with   Abdominal Pain   HPI: Arthur Patterson is a 62 y.o. male with medical history significant of hypertension, hyperlipidemia, lumbar disc disease, GERD, diverticulosis who presents to the ED due to abdominal pain.  Arthur Patterson states that on 11/16/2022, he began to experience generalized malaise and nausea.  The next day, he began to have multiple episodes of nonbilious, nonbloody vomit.  He states that he has had at least 5+ episodes of vomiting per day since then.  In addition, he has had diarrhea intermittently with the most recent episode yesterday.  He endorses generalized abdominal pain but thought it may have been due to frequent vomiting episodes.  He denies any fever, chills, chest pain, shortness of breath, lower extremity swelling, muscle twitching.  He endorses generalized weakness.  Last time he urinated was this morning, but states that he only had a few drops out.  [name] states that he was taking meloxicam nearly every day for several months but his last dose was on 3/6.  He has been still taking his home antihypertensives.  He denies any daily alcohol use.  ED course: On arrival to the ED, patient was hypotensive at 85/58 with heart rate of 91.  Blood pressure improved to 104/73 with fluid resuscitation.  He was saturating at 93% on room air with respiratory rate of 27.  He was afebrile at 98.2.  Initial workup notable for sodium of 113, potassium of 4.9, bicarb of 16, glucose of 113, BUN of 66, creatinine of 7.82, anion gap of 16, calcium of 4.9, AST 44 and GFR of 7.  Lipase elevated at 185.  WBC elevated at 14.5 with hemoglobin of 12.7 and platelets of 139.  COVID-19, influenza and RSV PCR negative.  Patient given 2 L of  normal saline and calcium gluconate.  TRH contacted for admission.  Review of Systems: As mentioned in the history of present illness. All other systems reviewed and are negative.  Past Medical History:  Diagnosis Date   Anxiety    Family history of adverse reaction to anesthesia    FAther had trouble waking   Hypertension    Sleep apnea    Spondylolisthesis at L4-L5 level    Past Surgical History:  Procedure Laterality Date   APPENDECTOMY     HERNIA REPAIR Bilateral    inguinal   TONSILLECTOMY     and adenoids   Social History:  reports that he has never smoked. He has never used smokeless tobacco. He reports that he does not currently use alcohol. He reports that he does not use drugs.  No Known Allergies  No family history on file.  Prior to Admission medications   Medication Sig Start Date End Date Taking? Authorizing Provider  ALPRAZolam Duanne Moron) 0.25 MG tablet Take 0.25 mg by mouth daily as needed for anxiety.   Yes [provider]  fluticasone (FLONASE) 50 MCG/ACT nasal spray Place 2 sprays into both nostrils daily. 02/20/22  Yes [provider]  gabapentin (NEURONTIN) 100 MG capsule Take 100-300 mg by mouth daily as needed (pain).   Yes [provider]  PARoxetine (PAXIL) 30 MG tablet Take 30 mg by mouth daily.   Yes [provider]  simvastatin (ZOCOR) 40 MG tablet  Take 1 tablet by mouth at bedtime. 06/01/22  Yes [provider]  telmisartan-hydrochlorothiazide (MICARDIS HCT) 80-12.5 MG tablet Take 1 tablet by mouth daily. 06/01/22  Yes [provider]    Physical Exam: Vitals:   11/20/22 1126 11/20/22 1127 11/20/22 1140 11/20/22 1230  BP: (!) 73/53  (!) 85/58 104/73  Pulse: (!) 101  91 87  Resp: (!) 21  (!) 27 15  Temp: 98.2 F (36.8 C)     TempSrc: Oral     SpO2: 95%  93% 99%  Weight:  93.4 kg     Physical Exam Vitals and nursing note reviewed.  Constitutional:      Appearance: He is obese. He is  ill-appearing.  HENT:     Head: Normocephalic and atraumatic.  Cardiovascular:     Rate and Rhythm: Normal rate and regular rhythm.     Heart sounds: No murmur heard.    Comments: Trace bilateral pitting edema Pulmonary:     Effort: Pulmonary effort is normal. No respiratory distress.     Breath sounds: Examination of the left-lower field reveals decreased breath sounds. Decreased breath sounds present. No wheezing, rhonchi or rales.  Abdominal:     General: Bowel sounds are normal. There is distension.     Palpations: Abdomen is soft.     Tenderness: There is generalized abdominal tenderness. There is no guarding.  Skin:    General: Skin is warm and dry.  Neurological:     General: No focal deficit present.     Mental Status: He is alert and oriented to person, place, and time.  Psychiatric:        Mood and Affect: Mood normal.        Behavior: Behavior normal.    Data Reviewed: CBC with WBC of 14.5, hemoglobin of 12.7, platelets of 139 CMP with sodium of 113, potassium 4.9, bicarb 16, glucose 113, BUN 66, creatinine 7.82, calcium 4.9, albumin 3.2, anion gap 16, AST 44, ALT 25 and GFR of 7. BMP obtained 45 minutes later with sodium of 116, potassium 4.2, bicarb 16, BUN 63, creatinine 7.19 and calcium of 4.4 and GFR of 8 Lipase elevated at 185 COVID-19, influenza and RSV PCR negative  EKG personally reviewed sinus rhythm with rate of 100 no ST or T wave changes concerning for acute ischemia.  J-point elevation in lateral leads.  Borderline QT prolongation at 466.  Results are pending, will review when available.  Assessment and Plan:  * Acute renal failure [facility]) Patient presenting with creatinine of 7.8, previously within normal limits.  Most likely secondary to significant hypovolemia in the setting of acute pancreatitis.  Last BMP obtained 1 week ago prior to symptom onset with creatinine of 1.06.   - Nephrology consulted; appreciate their recommendations - IV fluids -  Strict in and out - Urinalysis, urine sodium and osmol pending - Avoid nephrotoxic agents - Renal ultrasound to rule out hydronephrosis given history of BPH with LUTS  Acute pancreatitis Patient presenting with nausea and vomiting for 3 to 4 days in the setting of elevated lipase and evidence of markedly inflamed pancreas on CT imaging.  Etiology uncertain at this time as patient declines any recent alcohol use and no gallstones or gallbladder thickening seen on CT imaging.  Last lipid panel was obtained 6 months ago with no evidence of hypertriglyceridemia.  Only medication he takes that is known to cause pancreatitis is simvastatin.  - IV fluid resuscitation - N.p.o. - Dilaudid for pain control -  Zofran for nausea - Advance diet when able to tolerate p.o. intake  Hyponatremia In the setting of profound dehydration and acute pancreatitis.  Sodium has improved by 3 after receiving 2 L of fluids.  - Urinalysis, urine sodium and urine osmole's pending - Continue IV fluid resuscitation at 150 cc/h - BMP every 6 hours - Goal sodium by 3/13 a.m. = 121 - If overcorrection, will switch fluids to D5W  Hypocalcemia Likely secondary to pancreatitis.  No EKG changes or tetany on exam.  - Telemetry monitoring - S/p 1 g calcium gluconate - Correct magnesium with calcium sulfate - BMP every 6 hours  Anxiety - Continue home paroxetine and Xanax as needed  Essential hypertension - Hold home antihypertensives in the setting of AKI  OSA (obstructive sleep apnea) - CPAP at bedtime  Advance Care Planning:   Code Status: Full Code verified by patient  Consults: Nephrology  Family Communication: Patient's wife updated at bedside  Severity of Illness: The appropriate patient status for this patient is INPATIENT. Inpatient status is judged to be reasonable and necessary in order to provide the required intensity of service to ensure the patient's safety. The patient's presenting symptoms,  physical exam findings, and initial radiographic and laboratory data in the context of their chronic comorbidities is felt to place them at high risk for further clinical deterioration. Furthermore, it is not anticipated that the patient will be medically stable for discharge from the hospital within 2 midnights of admission.   * I certify that at the point of admission it is my clinical judgment that the patient will require inpatient hospital care spanning beyond 2 midnights from the point of admission due to high intensity of service, high risk for further deterioration and high frequency of surveillance required.*  Author: Jose Persia, MD 11/20/2022 2:28 PM  For on call review [email or web address].

## 2022-11-20 NOTE — ED Triage Notes (Signed)
Per pt, pt has been vomiting and since Saturday. Pt sts he has been trying to get fluids down to stay hydrated but that hasn't been working. Pt sts that he also feels weak like he is going to pass out.

## 2022-11-20 NOTE — Assessment & Plan Note (Addendum)
--  Hold home telmisartan-HCTZ in the setting of AKI, started on oral hydralazine instead. --cont hydralazine 100 mg q8h  --resume telmisartan at discharge --Stop HCTZ / thiazides with severe hyponatremia --PCP follow up --Advise home BP monitoring until follow up

## 2022-11-21 ENCOUNTER — Ambulatory Visit (HOSPITAL_COMMUNITY): Admission: RE | Admit: 2022-11-21 | Payer: 59 | Source: Ambulatory Visit | Admitting: Neurosurgery

## 2022-11-21 DIAGNOSIS — N179 Acute kidney failure, unspecified: Secondary | ICD-10-CM | POA: Diagnosis not present

## 2022-11-21 LAB — HEPATIC FUNCTION PANEL
ALT: 21 U/L (ref 0–44)
AST: 35 U/L (ref 15–41)
Albumin: 2.5 g/dL — ABNORMAL LOW (ref 3.5–5.0)
Alkaline Phosphatase: 84 U/L (ref 38–126)
Bilirubin, Direct: 0.3 mg/dL — ABNORMAL HIGH (ref 0.0–0.2)
Indirect Bilirubin: 0.8 mg/dL (ref 0.3–0.9)
Total Bilirubin: 1.1 mg/dL (ref 0.3–1.2)
Total Protein: 5.9 g/dL — ABNORMAL LOW (ref 6.5–8.1)

## 2022-11-21 LAB — BASIC METABOLIC PANEL
Anion gap: 14 (ref 5–15)
Anion gap: 13 (ref 5–15)
BUN: 53 mg/dL — ABNORMAL HIGH (ref 8–23)
BUN: 48 mg/dL — ABNORMAL HIGH (ref 8–23)
CO2: 14 mmol/L — ABNORMAL LOW (ref 22–32)
CO2: 19 mmol/L — ABNORMAL LOW (ref 22–32)
Calcium: 5.2 mg/dL — CL (ref 8.9–10.3)
Calcium: 6 mg/dL — CL (ref 8.9–10.3)
Chloride: 90 mmol/L — ABNORMAL LOW (ref 98–111)
Chloride: 90 mmol/L — ABNORMAL LOW (ref 98–111)
Creatinine, Ser: 5.07 mg/dL — ABNORMAL HIGH (ref 0.61–1.24)
Creatinine, Ser: 3.57 mg/dL — ABNORMAL HIGH (ref 0.61–1.24)
GFR, Estimated: 12 mL/min — ABNORMAL LOW (ref >60)
GFR, Estimated: 18 mL/min — ABNORMAL LOW (ref >60)
Glucose, Bld: 85 mg/dL (ref 70–99)
Glucose, Bld: 116 mg/dL — ABNORMAL HIGH (ref 70–99)
Potassium: 3.5 mmol/L (ref 3.5–5.1)
Potassium: 3.2 mmol/L — ABNORMAL LOW (ref 3.5–5.1)
Sodium: 118 mmol/L — CL (ref 135–145)
Sodium: 122 mmol/L — ABNORMAL LOW (ref 135–145)

## 2022-11-21 LAB — CBC
HCT: 32.4 % — ABNORMAL LOW (ref 39.0–52.0)
Hemoglobin: 11.5 g/dL — ABNORMAL LOW (ref 13.0–17.0)
MCH: 33.6 pg (ref 26.0–34.0)
MCHC: 35.5 g/dL (ref 30.0–36.0)
MCV: 94.7 fL (ref 80.0–100.0)
Platelets: 129 10*3/uL — ABNORMAL LOW (ref 150–400)
RBC: 3.42 MIL/uL — ABNORMAL LOW (ref 4.22–5.81)
RDW: 13.4 % (ref 11.5–15.5)
WBC: 12.8 10*3/uL — ABNORMAL HIGH (ref 4.0–10.5)
nRBC: 0 % (ref 0.0–0.2)

## 2022-11-21 LAB — HIV ANTIBODY (ROUTINE TESTING W REFLEX): HIV Screen 4th Generation wRfx: NONREACTIVE

## 2022-11-21 LAB — MAGNESIUM: Magnesium: 2.5 mg/dL — ABNORMAL HIGH (ref 1.7–2.4)

## 2022-11-21 SURGERY — POSTERIOR LUMBAR FUSION 1 LEVEL
Anesthesia: General

## 2022-11-21 MED ORDER — HYDROCODONE-ACETAMINOPHEN 5-325 MG PO TABS
1.0000 | ORAL_TABLET | Freq: Four times a day (QID) | ORAL | Status: DC | PRN
  Administered 2022-11-21: 1 via ORAL
  Filled 2022-11-21: qty 1

## 2022-11-21 MED ORDER — CALCIUM GLUCONATE-NACL 2-0.675 GM/100ML-% IV SOLN
2.0000 g | Freq: Once | INTRAVENOUS | Status: AC
  Administered 2022-11-21: 2000 mg via INTRAVENOUS
  Filled 2022-11-21: qty 100

## 2022-11-21 MED ORDER — HYDROCODONE-ACETAMINOPHEN 5-325 MG PO TABS
1.0000 | ORAL_TABLET | ORAL | Status: DC | PRN
  Administered 2022-11-21 – 2022-11-22 (×8): 2 via ORAL
  Filled 2022-11-21 (×8): qty 2

## 2022-11-21 MED ORDER — LACTATED RINGERS IV SOLN: INTRAVENOUS | Status: DC

## 2022-11-21 MED ORDER — GABAPENTIN 300 MG PO CAPS
300.0000 mg | ORAL_CAPSULE | Freq: Three times a day (TID) | ORAL | Status: DC
  Administered 2022-11-21 – 2022-12-02 (×34): 300 mg via ORAL
  Filled 2022-11-21 (×35): qty 1

## 2022-11-21 MED ORDER — DICLOFENAC SODIUM 1 % EX GEL
4.0000 g | Freq: Four times a day (QID) | CUTANEOUS | Status: DC
  Administered 2022-11-21 – 2022-12-02 (×40): 4 g via TOPICAL
  Filled 2022-11-21 (×2): qty 100

## 2022-11-21 MED ORDER — HYDROMORPHONE HCL 1 MG/ML IJ SOLN
0.5000 mg | Freq: Once | INTRAMUSCULAR | Status: AC
  Administered 2022-11-21: 0.5 mg via INTRAVENOUS
  Filled 2022-11-21: qty 1

## 2022-11-21 NOTE — TOC Initial Note (Signed)
Transition of Care [facility]) - Initial/Assessment Note    Patient Details  Name: Arthur Patterson MRN: [id number] Date of Birth: 11-17-1960  Transition of Care [facility]) CM/SW Contact:    Laurena Slimmer, RN Phone Number: 11/21/2022, 4:28 PM  Clinical Narrative:                  Transition of Care [facility]) Screening Note   Patient Details  Name: Arthur Patterson Date of Birth: 1961/02/26   Transition of Care [facility]) CM/SW Contact:    Laurena Slimmer, RN Phone Number: 11/21/2022, 4:28 PM    Transition of Care Department [facility]) has reviewed patient and no TOC needs have been identified at this time. We will continue to monitor patient advancement through interdisciplinary progression rounds. If new patient transition needs arise, please place a TOC consult.          Patient Goals and CMS Choice            Expected Discharge Plan and Services                                              Prior Living Arrangements/Services                       Activities of Daily Living      Permission Sought/Granted                  Emotional Assessment              Admission diagnosis:  Hypocalcemia [E83.51] Nausea [R11.0] Acute renal failure ([place]) [N17.9] AKI (acute kidney injury) ([place]) [N17.9] Patient Active Problem List   Diagnosis Date Noted   Acute renal failure ([place]) 11/20/2022   Hyponatremia 11/20/2022   Hypocalcemia 11/20/2022   Essential hypertension 11/20/2022   Anxiety 11/20/2022   Acute pancreatitis 11/20/2022   Tubular adenoma 06/01/2022   Hyperlipidemia, mixed 05/16/2017   Benign localized prostatic hyperplasia with lower urinary tract symptoms (LUTS) 06/19/2016   OSA (obstructive sleep apnea) 02/02/2014   Lumbar disc disease 02/02/2014   PCP:  Rusty Aus, MD Pharmacy:   [id number] [id number]: [phone]: [phone]    Social  Determinants of Health (SDOH) Social History: SDOH Screenings   Tobacco Use: Low Risk  (11/13/2022)   SDOH Interventions:     Readmission Risk Interventions     No data to display

## 2022-11-21 NOTE — Progress Notes (Signed)
PROGRESS NOTE    DUC REISCHMAN  [id number] DOB: 1961-05-23 DOA: 11/20/2022 PCP: Rusty Aus, MD  250A/250A-AA  LOS: 1 day   Brief hospital course:   Assessment & Plan: Arthur Patterson is a 62 y.o. male with medical history significant of hypertension, hyperlipidemia, lumbar disc disease, GERD, diverticulosis who presents to the ED due to abdominal pain.   Mr. Wendler states that on 11/16/2022, he began to experience generalized malaise and nausea.  The next day, he began to have multiple episodes of nonbilious, nonbloody vomit.  He states that he has had at least 5+ episodes of vomiting per day since then.  In addition, he has had diarrhea intermittently with the most recent episode yesterday.  Mr. Rigoni states that he was taking meloxicam nearly every day for several months but his last dose was on 3/6. He has been still taking his home antihypertensives. He denies any daily alcohol use.    * Acute renal failure [facility]) Patient presenting with creatinine of 7.8, previously within normal limits.  Most likely secondary to significant hypovolemia in the setting of acute pancreatitis, and N/V/D.  Last BMP obtained 1 week ago prior to symptom onset with creatinine of 1.06.  - Nephrology consulted; appreciate their recommendations --cont LR'@200'$ , per nephro  Acute pancreatitis Patient presenting with nausea and vomiting for 3 to 4 days in the setting of elevated lipase and evidence of markedly inflamed pancreas on CT imaging.  Etiology uncertain at this time as patient declines any recent alcohol use and no gallstones or gallbladder thickening seen on CT imaging.  Last lipid panel was obtained 6 months ago with no evidence of hypertriglyceridemia.  Only medication he takes that is known to cause pancreatitis is simvastatin. --cont LR'@200'$ , per nephro --clear liquid diet   Hyponatremia 2/2 hypovolemia Na 113 on presentation.  In the setting of profound dehydration and acute pancreatitis.    --Na improved by 9 in 1 day with IVF, however, per nephrology, ok to continue IVF since hyponatremia was due to hypovolemia. --cont LR'@200'$ , per nephro  Hypocalcemia Likely secondary to pancreatitis.  No EKG changes or tetany on exam. --s/p IV cal gluconate x7g --monitor and replete PRN  Hypomag --monitor and replete PRN  Anxiety - Continue home paroxetine and Xanax as needed  Essential hypertension - Hold home antihypertensives in the setting of AKI  OSA (obstructive sleep apnea) - CPAP at bedtime  Chronic back pain --worsened since presentation --Norco PRN --resume home gabapentin (monitor for sedation) --Voltaren gel   DVT prophylaxis: Heparin SQ Code Status: Full code  Family Communication: wife updated at bedside today Level of care: Progressive Dispo:   The patient is from: home Anticipated d/c is to: home Anticipated d/c date is: > 3 days   Subjective and Interval History:  Pt complained of worsening of his chronic back pain.     Objective: Vitals:   11/21/22 0734 11/21/22 0807 11/21/22 1234 11/21/22 1554  BP: 119/83  (!) 143/80 (!) 150/91  Pulse: (!) 103  91 93  Resp: '18 16 20 20  '$ Temp: 98.3 F (36.8 C)  98.1 F (36.7 C) 98 F (36.7 C)  TempSrc:   Oral Oral  SpO2: 99%  98% 99%  Weight:        Intake/Output Summary (Last 24 hours) at 11/21/2022 1910 Last data filed at 11/21/2022 1845 Gross per 24 hour  Intake 4244.63 ml  Output 3800 ml  Net 444.63 ml   Filed Weights   11/20/22 1127  Weight: 93.4 kg    Examination:   Constitutional: NAD, AAOx3 HEENT: conjunctivae and lids normal, EOMI CV: No cyanosis.   RESP: normal respiratory effort, on RA Neuro: II - XII grossly intact.   Psych: Normal mood and affect.  Appropriate judgement and reason   Data Reviewed: I have personally reviewed labs and imaging studies  Time spent: 50 minutes  Enzo Bi, MD [facility] If 7PM-7AM, please contact night-coverage 11/21/2022, 7:10 PM

## 2022-11-21 NOTE — Consult Note (Signed)
[facility]  CONSULT NOTE    Date: 11/21/2022                  Patient Name:  ZAQUAN WHISLER  MRN: [id number]  DOB: [birth year]  Age / Sex: 62 y.o., male         PCP: Rusty Aus, MD                 Service Requesting Consult: [facility]                 Reason for Consult: Hyponatremia and acute kidney failure            History of Present Illness: Mr. LIGE BLANCHETTE is a 62 y.o.  male with past medical continues including hyperlipidemia, GERD, hypertension, lumbar disc disease, and diverticulosis, who was admitted to [facility] on 11/20/2022 for Hypocalcemia [E83.51] Nausea [R11.0] Acute renal failure ([place]) [N17.9] AKI (acute kidney injury) ([place]) [N17.9]  Patient presents to the emergency room with complaints of nausea, vomiting, and abdominal pain.  He is seen and evaluated seated in chair, no family at bedside.  Patient states he began having concerns last Friday with generalized malaise and fatigue.  Patient states he became nauseous on Saturday with multiple vomiting episodes daily during that time.  He is also stated he has had intermittent diarrhea.  Patient currently is prescribed meloxicam and naproxen for chronic back pain.  States he has taken these medications for months.  Patient was currently scheduled to receive back surgery today at [facility].  Denies shortness of breath or cough.  Denies chest pain.  No known fever or chills.  No lower extremity edema.  Labs on ED arrival significant for sodium 113, serum bicarb 16, glucose 113, BUN 66, creatinine 7.82 with GFR 7, and calcium 4.9.  Elevated WBCs at 14.5 with hemoglobin 12.7.  Respiratory panel negative for influenza, COVID-19, or RSV.  UA appears cloudy with some hematuria and proteinuria.  CT abdomen pelvis consistent with acute severe pancreatitis.Chest x-ray shows small left pleural effusion.  Renal ultrasound negative for obstruction.  Patient given IV fluids for hydration and correction of  sodium in emergency department.  Medications: Outpatient medications: Medications Prior to Admission  Medication Sig Dispense Refill Last Dose   ALPRAZolam (XANAX) 0.25 MG tablet Take 0.25 mg by mouth daily as needed for anxiety.   Past Week   fluticasone (FLONASE) 50 MCG/ACT nasal spray Place 2 sprays into both nostrils daily.   unk   gabapentin (NEURONTIN) 100 MG capsule Take 100-300 mg by mouth daily as needed (pain).   Past Week   PARoxetine (PAXIL) 30 MG tablet Take 30 mg by mouth daily.   11/19/2022   simvastatin (ZOCOR) 40 MG tablet Take 1 tablet by mouth at bedtime.   11/19/2022   telmisartan-hydrochlorothiazide (MICARDIS HCT) 80-12.5 MG tablet Take 1 tablet by mouth daily.   11/20/2022 at 0800    Current medications: Current Facility-Administered Medications  Medication Dose Route Frequency Provider Last Rate Last Admin   acetaminophen (TYLENOL) tablet 650 mg  650 mg Oral Q6H PRN Jose Persia, MD   650 mg at 11/21/22 0032   Or   acetaminophen (TYLENOL) suppository 650 mg  650 mg Rectal Q6H PRN Jose Persia, MD       ALPRAZolam Duanne Moron) tablet 0.25 mg  0.25 mg Oral Daily PRN Jose Persia, MD   0.25 mg at 11/21/22 0032   diclofenac Sodium (VOLTAREN) 1 % topical gel 4  g  4 g Topical QID Enzo Bi, MD   4 g at 11/21/22 1215   gabapentin (NEURONTIN) capsule 300 mg  300 mg Oral TID Enzo Bi, MD   300 mg at 11/21/22 1212   heparin injection 5,000 Units  5,000 Units Subcutaneous Q8H Jose Persia, MD   5,000 Units at 11/20/22 2134   HYDROcodone-acetaminophen (NORCO/VICODIN) 5-325 MG per tablet 1-2 tablet  1-2 tablet Oral Q4H PRN Enzo Bi, MD   2 tablet at 11/21/22 1212   lactated ringers infusion   Intravenous Continuous Jose Persia, MD 200 mL/hr at 11/21/22 1200 Infusion Verify at 11/21/22 1200   ondansetron (ZOFRAN) tablet 4 mg  4 mg Oral Q6H PRN Jose Persia, MD       Or   ondansetron (ZOFRAN) injection 4 mg  4 mg Intravenous Q6H PRN Jose Persia, MD        PARoxetine (PAXIL) tablet 30 mg  30 mg Oral Daily Jose Persia, MD   30 mg at 11/21/22 [id number]   sodium chloride flush (NS) 0.9 % injection 3 mL  3 mL Intravenous Q12H Jose Persia, MD   3 mL at 11/20/22 2100      Allergies: No Known Allergies    Past Medical History: Past Medical History:  Diagnosis Date   Anxiety    Family history of adverse reaction to anesthesia    FAther had trouble waking   Hypertension    Sleep apnea    Spondylolisthesis at L4-L5 level      Past Surgical History: Past Surgical History:  Procedure Laterality Date   APPENDECTOMY     HERNIA REPAIR Bilateral    inguinal   TONSILLECTOMY     and adenoids     Family History: No family history on file.   Social History: Social History   Socioeconomic History   Marital status: Married    Spouse name: Amy   Number of children: 2   Years of education: Not on file   Highest education level: Not on file  Occupational History   Not on file  Tobacco Use   Smoking status: Never   Smokeless tobacco: Never  Vaping Use   Vaping Use: Never used  Substance and Sexual Activity   Alcohol use: Not Currently    Comment: occasionally   Drug use: Never   Sexual activity: Yes  Other Topics Concern   Not on file  Social History Narrative   Not on file   Social Determinants of Health   Financial Resource Strain: Not on file  Food Insecurity: Not on file  Transportation Needs: Not on file  Physical Activity: Not on file  Stress: Not on file  Social Connections: Not on file  Intimate Partner Violence: Not on file     Review of Systems: Review of Systems  Constitutional:  Positive for malaise/fatigue. Negative for chills and fever.  HENT:  Negative for congestion, sore throat and tinnitus.   Eyes:  Negative for blurred vision and redness.  Respiratory:  Negative for cough, shortness of breath and wheezing.   Cardiovascular:  Negative for chest pain, palpitations, claudication and leg swelling.   Gastrointestinal:  Positive for abdominal pain, diarrhea, nausea and vomiting. Negative for blood in stool.  Genitourinary:  Negative for flank pain, frequency and hematuria.  Musculoskeletal:  Negative for back pain, falls and myalgias.  Skin:  Negative for rash.  Neurological:  Negative for dizziness, weakness and headaches.  Endo/Heme/Allergies:  Does not bruise/bleed easily.  Psychiatric/Behavioral:  Negative for  depression. The patient is not nervous/anxious and does not have insomnia.     Vital Signs: Blood pressure (!) 143/80, pulse 91, temperature 98.1 F (36.7 C), resp. rate 20, weight 93.4 kg, SpO2 98 %.  Weight trends: Filed Weights   11/20/22 1127  Weight: 93.4 kg    Physical Exam: General: NAD, sitting in chair  Head: Normocephalic, atraumatic. Moist oral mucosal membranes  Eyes: Anicteric  Lungs:  Clear to auscultation, normal effort, room air  Heart: Regular rate and rhythm  Abdomen:  Soft, tender, mild distention  Extremities: No peripheral edema.  Neurologic: Alert and oriented, moving all four extremities  Skin: No lesions  Access: None     Lab results: Basic Metabolic Panel: Recent Labs  Lab 11/20/22 1212 11/20/22 1810 11/21/22 0500 11/21/22 0502  NA 116* 116*  --  118*  K 4.2 4.1  --  3.5  CL 87* 87*  --  90*  CO2 16* 16*  --  14*  GLUCOSE 101* 88  --  85  BUN 63* 61*  --  53*  CREATININE 7.19* 6.78*  --  5.07*  CALCIUM 4.4* 4.7*  --  5.2*  MG 1.5*  --  2.5*  --     Liver Function Tests: Recent Labs  Lab 11/20/22 1133 11/21/22 0500  AST 44* 35  ALT 25 21  ALKPHOS 86 84  BILITOT 1.1 1.1  PROT 6.9 5.9*  ALBUMIN 3.2* 2.5*   Recent Labs  Lab 11/20/22 1133  LIPASE 185*   No results for input(s): "AMMONIA" in the last 168 hours.  CBC: Recent Labs  Lab 11/20/22 1133 11/21/22 0500  WBC 14.5* 12.8*  HGB 12.7* 11.5*  HCT 35.9* 32.4*  MCV 95.5 94.7  PLT 139* 129*    Cardiac Enzymes: No results for input(s): "CKTOTAL",  "CKMB", "CKMBINDEX", "TROPONINI" in the last 168 hours.  BNP: Invalid input(s): "POCBNP"  CBG: No results for input(s): "GLUCAP" in the last 168 hours.  Microbiology: Results for orders placed or performed during the hospital encounter of 11/20/22  Resp panel by RT-PCR (RSV, Flu A&B, Covid) Anterior Nasal Swab     Status: None   Collection Time: 11/20/22 12:01 PM   Specimen: Anterior Nasal Swab  Result Value Ref Range Status   SARS Coronavirus 2 by RT PCR NEGATIVE NEGATIVE Final    Comment: (NOTE) SARS-CoV-2 target nucleic acids are NOT DETECTED.  The SARS-CoV-2 RNA is generally detectable in upper respiratory specimens during the acute phase of infection. The lowest concentration of SARS-CoV-2 viral copies this assay can detect is 138 copies/mL. A negative result does not preclude SARS-Cov-2 infection and should not be used as the sole basis for treatment or other patient management decisions. A negative result may occur with  improper specimen collection/handling, submission of specimen other than nasopharyngeal swab, presence of viral mutation(s) within the areas targeted by this assay, and inadequate number of viral copies(<138 copies/mL). A negative result must be combined with clinical observations, patient history, and epidemiological information. The expected result is Negative.  Fact Sheet for Patients:  EntrepreneurPulse.com.au  Fact Sheet for Healthcare Providers:  IncredibleEmployment.be  This test is no t yet approved or cleared by the [place] FDA and  has been authorized for detection and/or diagnosis of SARS-CoV-2 by FDA under an Emergency Use Authorization (EUA). This EUA will remain  in effect (meaning this test can be used) for the duration of the COVID-19 declaration under Section 564(b)(1) of the Act, 21 U.S.C.section 360bbb-3(b)(1), unless the  authorization is terminated  or revoked sooner.       Influenza A  by PCR NEGATIVE NEGATIVE Final   Influenza B by PCR NEGATIVE NEGATIVE Final    Comment: (NOTE) The Xpert Xpress SARS-CoV-2/FLU/RSV plus assay is intended as an aid in the diagnosis of influenza from Nasopharyngeal swab specimens and should not be used as a sole basis for treatment. Nasal washings and aspirates are unacceptable for Xpert Xpress SARS-CoV-2/FLU/RSV testing.  Fact Sheet for Patients: EntrepreneurPulse.com.au  Fact Sheet for Healthcare Providers: IncredibleEmployment.be  This test is not yet approved or cleared by the [place] FDA and has been authorized for detection and/or diagnosis of SARS-CoV-2 by FDA under an Emergency Use Authorization (EUA). This EUA will remain in effect (meaning this test can be used) for the duration of the COVID-19 declaration under Section 564(b)(1) of the Act, 21 U.S.C. section 360bbb-3(b)(1), unless the authorization is terminated or revoked.     Resp Syncytial Virus by PCR NEGATIVE NEGATIVE Final    Comment: (NOTE) Fact Sheet for Patients: EntrepreneurPulse.com.au  Fact Sheet for Healthcare Providers: IncredibleEmployment.be  This test is not yet approved or cleared by the [place] FDA and has been authorized for detection and/or diagnosis of SARS-CoV-2 by FDA under an Emergency Use Authorization (EUA). This EUA will remain in effect (meaning this test can be used) for the duration of the COVID-19 declaration under Section 564(b)(1) of the Act, 21 U.S.C. section 360bbb-3(b)(1), unless the authorization is terminated or revoked.  Performed at [facility], [address] [place], [place] [address]     Coagulation Studies: No results for input(s): "LABPROT", "INR" in the last 72 hours.  Urinalysis: Recent Labs    11/20/22 1200  COLORURINE YELLOW*  LABSPEC 1.011  PHURINE 5.0  GLUCOSEU 50*  HGBUR MODERATE*  BILIRUBINUR NEGATIVE   KETONESUR NEGATIVE  PROTEINUR 100*  NITRITE NEGATIVE  LEUKOCYTESUR NEGATIVE      Imaging: US RENAL  Result Date: 11/20/2022 CLINICAL DATA:  AK I EXAM: RENAL / URINARY TRACT ULTRASOUND COMPLETE COMPARISON:  None Available. FINDINGS: Right Kidney: Renal measurements: 12.3 x 6.2 x 5.1 cm = volume: 203 mL. Echogenicity within normal limits. No mass or hydronephrosis visualized. Left Kidney: Renal measurements: 12.8 x 6.7 x 5.4 cm = volume: 241 mL. Echogenicity within normal limits. No mass or hydronephrosis visualized. Bladder: Appears normal for degree of bladder distention. Other: None. IMPRESSION: No significant sonographic abnormality of the kidneys. Electronically Signed   By: Marin Roberts M.D.   On: 11/20/2022 16:13   DG Chest 1 View  Result Date: 11/20/2022 CLINICAL DATA:  Vomiting and weakness.  Acute pancreatitis. EXAM: CHEST  1 VIEW COMPARISON:  CT scan abdomen/pelvis, same date. FINDINGS: The cardiac silhouette, mediastinal and hilar contours are within normal limits. Small left pleural effusion and bibasilar atelectasis. No infiltrates or pulmonary edema. No pulmonary lesions. The bony thorax is intact. IMPRESSION: Small left pleural effusion and bibasilar atelectasis. Electronically Signed   By: Marijo Sanes M.D.   On: 11/20/2022 13:46   CT ABDOMEN PELVIS WO CONTRAST  Result Date: 11/20/2022 CLINICAL DATA:  Acute severe abdominal pain vomiting since Saturday EXAM: CT ABDOMEN AND PELVIS WITHOUT CONTRAST TECHNIQUE: Multidetector CT imaging of the abdomen and pelvis was performed following the standard protocol without IV contrast. RADIATION DOSE REDUCTION: This exam was performed according to the departmental dose-optimization program which includes automated exposure control, adjustment of the mA and/or kV according to patient size and/or use of iterative reconstruction technique. COMPARISON:  CT  examination dated May 26, 2020 FINDINGS: Lower chest: Small left pleural effusion  with left basilar atelectasis. Hepatobiliary: No focal liver abnormality is seen. No gallstones, gallbladder wall thickening, or biliary dilatation. Pancreas: There is marked peripancreatic edema with low-attenuation of the pancreas suggesting acute severe pancreatitis no peripancreatic fluid collection or pseudocyst. Spleen: Normal in size without focal abnormality. Adrenals/Urinary Tract: Adrenal glands are unremarkable. Kidneys are normal, without renal calculi, focal lesion, or hydronephrosis. Bladder is unremarkable. Stomach/Bowel: Stomach is within normal limits. Appendix not identified, correlate with prior clinical history. No evidence of bowel wall thickening, distention, or inflammatory changes. Scattered colonic diverticulosis without evidence of acute diverticulitis. Vascular/Lymphatic: No significant vascular findings are present. No enlarged abdominal or pelvic lymph nodes. Reproductive: Prostate is unremarkable. Other: No abdominal wall hernia or abnormality. Small amount of fluid in the left paracolic gutter, likely reactive secondary to severe pancreatitis. Musculoskeletal: Multilevel degenerate disc disease with levoscoliosis centered at L3 vertebral body. No acute osseous abnormality. IMPRESSION: 1. Marked peripancreatic edema with low-attenuation of the pancreas consistent with acute severe pancreatitis. No peripancreatic fluid collection or pseudocyst. 2. Small left pleural effusion with left basilar atelectasis. 3. Scattered colonic diverticulosis without evidence of acute diverticulitis. 4. Multilevel degenerate disc disease with levoscoliosis centered at L3 vertebral body. Electronically Signed   By: Keane Police D.O.   On: 11/20/2022 13:43     Assessment & Plan: Mr. ANDRAY DINOLFO is a 62 y.o.  male with past medical continues including hyperlipidemia, GERD, hypertension, lumbar disc disease, and diverticulosis, who was admitted to [facility] on 11/20/2022 for Hypocalcemia [E83.51] Nausea  [R11.0] Acute renal failure ([place]) [N17.9] AKI (acute kidney injury) ([place]) [N17.9]  Acute kidney injury likely secondary to severe illness and hypovolemia.  Normal renal function noted on previous labs.  No IV contrast exposure.  Creatinine on ED arrival 7.82 with GFR 7.  Renal ultrasound negative for obstruction.  Agree with IV infusion for now.  This appears to be dehydration.  Avoid nephrotoxic agents and therapies if possible.  Including hypotension.  No acute indication for dialysis.  Will monitor closely.  2.  Severe hyponatremia, sodium on admission 113.  Currently receiving lactated Ringer's at 200 mL/h.  No indication for tolvaptan at this time.  3.  Hypertension, essential.  Home regimen currently telmisartan-hydrochlorothiazide.  Currently held   LOS: 1 [place] 3/13/202412:57 PM

## 2022-11-21 NOTE — Progress Notes (Signed)
Notified Dr. Billie Ruddy that patient's Ca level is 6 and k+ 3.2. MD acknowledged and ordered calcium gluconate.

## 2022-11-22 ENCOUNTER — Encounter: Payer: Self-pay | Admitting: Internal Medicine

## 2022-11-22 ENCOUNTER — Other Ambulatory Visit: Payer: Self-pay

## 2022-11-22 DIAGNOSIS — N179 Acute kidney failure, unspecified: Secondary | ICD-10-CM | POA: Diagnosis not present

## 2022-11-22 LAB — BASIC METABOLIC PANEL
Anion gap: 8 (ref 5–15)
BUN: 28 mg/dL — ABNORMAL HIGH (ref 8–23)
CO2: 21 mmol/L — ABNORMAL LOW (ref 22–32)
Calcium: 6.2 mg/dL — CL (ref 8.9–10.3)
Chloride: 91 mmol/L — ABNORMAL LOW (ref 98–111)
Creatinine, Ser: 1.94 mg/dL — ABNORMAL HIGH (ref 0.61–1.24)
GFR, Estimated: 38 mL/min — ABNORMAL LOW (ref >60)
Glucose, Bld: 91 mg/dL (ref 70–99)
Potassium: 3.3 mmol/L — ABNORMAL LOW (ref 3.5–5.1)
Sodium: 120 mmol/L — ABNORMAL LOW (ref 135–145)

## 2022-11-22 LAB — CBC
HCT: 32.4 % — ABNORMAL LOW (ref 39.0–52.0)
Hemoglobin: 11.5 g/dL — ABNORMAL LOW (ref 13.0–17.0)
MCH: 33.6 pg (ref 26.0–34.0)
MCHC: 35.5 g/dL (ref 30.0–36.0)
MCV: 94.7 fL (ref 80.0–100.0)
Platelets: 149 10*3/uL — ABNORMAL LOW (ref 150–400)
RBC: 3.42 MIL/uL — ABNORMAL LOW (ref 4.22–5.81)
RDW: 13.5 % (ref 11.5–15.5)
WBC: 16.7 10*3/uL — ABNORMAL HIGH (ref 4.0–10.5)
nRBC: 0 % (ref 0.0–0.2)

## 2022-11-22 LAB — GLUCOSE, CAPILLARY: Glucose-Capillary: 158 mg/dL — ABNORMAL HIGH (ref 70–99)

## 2022-11-22 LAB — LIPASE, BLOOD: Lipase: 41 U/L (ref 11–51)

## 2022-11-22 LAB — MAGNESIUM: Magnesium: 2.3 mg/dL (ref 1.7–2.4)

## 2022-11-22 LAB — PHOSPHORUS: Phosphorus: 2.6 mg/dL (ref 2.5–4.6)

## 2022-11-22 MED ORDER — POLYETHYLENE GLYCOL 3350 17 G PO PACK
34.0000 g | PACK | Freq: Two times a day (BID) | ORAL | Status: DC
  Administered 2022-11-22 – 2022-11-28 (×7): 34 g via ORAL
  Filled 2022-11-22 (×13): qty 2

## 2022-11-22 MED ORDER — HYDROCODONE-ACETAMINOPHEN 5-325 MG PO TABS
1.0000 | ORAL_TABLET | Freq: Four times a day (QID) | ORAL | Status: DC | PRN
  Administered 2022-11-23 – 2022-11-27 (×3): 2 via ORAL
  Administered 2022-11-27: 1 via ORAL
  Administered 2022-11-27 – 2022-11-29 (×5): 2 via ORAL
  Filled 2022-11-22 (×7): qty 2
  Filled 2022-11-22: qty 1
  Filled 2022-11-22: qty 2

## 2022-11-22 MED ORDER — POTASSIUM CHLORIDE CRYS ER 20 MEQ PO TBCR
40.0000 meq | EXTENDED_RELEASE_TABLET | Freq: Once | ORAL | Status: AC
  Administered 2022-11-22: 40 meq via ORAL
  Filled 2022-11-22: qty 2

## 2022-11-22 NOTE — Progress Notes (Signed)
PROGRESS NOTE    TAIJUAN FETCH  [id number] DOB: [birth year] DOA: 11/20/2022 PCP: Rusty Aus, MD  250A/250A-AA  LOS: 2 days   Brief hospital course:   Assessment & Plan: Arthur Patterson is a 62 y.o. male with medical history significant of hypertension, hyperlipidemia, lumbar disc disease, GERD, diverticulosis who presents to the ED due to abdominal pain.   Mr. Schnipke states that on 11/16/2022, he began to experience generalized malaise and nausea.  The next day, he began to have multiple episodes of nonbilious, nonbloody vomit.  He states that he has had at least 5+ episodes of vomiting per day since then.  In addition, he has had diarrhea intermittently with the most recent episode yesterday.  Mr. Durrett states that he was taking meloxicam nearly every day for several months but his last dose was on 3/6. He has been still taking his home antihypertensives. He denies any daily alcohol use.    * Acute renal failure [facility]) Patient presenting with creatinine of 7.8, previously within normal limits.  Most likely secondary to significant hypovolemia in the setting of acute pancreatitis, and N/V/D.  Last BMP obtained 1 week ago prior to symptom onset with creatinine of 1.06.  - Nephrology consulted; appreciate their recommendations --Cr improving with IVF --cont LR'@100'$ , per nephro  Acute pancreatitis Patient presenting with nausea and vomiting for 3 to 4 days in the setting of elevated lipase and evidence of markedly inflamed pancreas on CT imaging.  Etiology uncertain at this time as patient declines any recent alcohol use and no gallstones or gallbladder thickening seen on CT imaging.  Last lipid panel was obtained 6 months ago with no evidence of hypertriglyceridemia.  Only medication he takes that is known to cause pancreatitis is simvastatin. --cont LR'@100'$ , per nephro --advance to low fat diet  Hyponatremia 2/2 hypovolemia Na 113 on presentation.  In the setting of profound  dehydration and acute pancreatitis.   --Na improved by 9 in 1 day with IVF, however, per nephrology, ok to continue IVF since hyponatremia was due to hypovolemia. --cont LR'@100'$ , per nephro  Hypocalcemia Likely secondary to pancreatitis.  No EKG changes or tetany on exam. --s/p IV cal gluconate x7g --monitor and replete PRN  Hypomag --monitor and replete PRN  Anxiety - Continue home paroxetine and Xanax as needed  Essential hypertension - Hold home antihypertensives in the setting of AKI  OSA (obstructive sleep apnea) - CPAP at bedtime  Chronic back pain --worsened since presentation --Norco PRN --cont home gabapentin (monitor for sedation) --Voltaren gel   DVT prophylaxis: Heparin SQ Code Status: Full code  Family Communication: wife updated at bedside today Level of care: Progressive Dispo:   The patient is from: home Anticipated d/c is to: home Anticipated d/c date is: 1-2 days   Subjective and Interval History:  Back pain controlled while on Norco.  No abdominal pain.  Tolerating clear liquid diet.   Objective: Vitals:   11/22/22 0516 11/22/22 0739 11/22/22 1203 11/22/22 1527  BP: 122/77 (!) 150/96 (!) 143/103 111/80  Pulse: 92 95 96 (!) 104  Resp: '15 16 14 16  '$ Temp: 98 F (36.7 C) 98.2 F (36.8 C) 98.1 F (36.7 C) 99 F (37.2 C)  TempSrc: Axillary     SpO2: 96% 98% 96% 96%  Weight: 93.4 kg     Height: '5\' 9"'$  (1.753 m)       Intake/Output Summary (Last 24 hours) at 11/22/2022 1825 Last data filed at 11/22/2022 1300 Gross per 24  hour  Intake 1150 ml  Output 2350 ml  Net -1200 ml   Filed Weights   11/20/22 1127 11/22/22 0516  Weight: 93.4 kg 93.4 kg    Examination:   Constitutional: NAD, AAOx3 HEENT: conjunctivae and lids normal, EOMI CV: No cyanosis.   RESP: normal respiratory effort, on RA Neuro: II - XII grossly intact.   Psych: Normal mood and affect.  Appropriate judgement and reason   Data Reviewed: I have personally reviewed labs  and imaging studies  Time spent: 35 minutes  Enzo Bi, MD [facility] If 7PM-7AM, please contact night-coverage 11/22/2022, 6:25 PM

## 2022-11-22 NOTE — Progress Notes (Addendum)
[facility]  ROUNDING NOTE   Subjective:   Patient seen sitting up in chair, alert and oriented Patient states he continues to have back discomfort Appetite continues to improve Denies nausea or vomiting No lower extremity edema  Creatinine 1.94 Urine output 2.65 L  Objective:  Vital signs in last 24 hours:  Temp:  [98 F (36.7 C)-99 F (37.2 C)] 99 F (37.2 C) (03/14 1527) Pulse Rate:  [92-104] 104 (03/14 1527) Resp:  [14-18] 16 (03/14 1527) BP: (111-150)/(77-103) 111/80 (03/14 1527) SpO2:  [93 %-98 %] 96 % (03/14 1527) Weight:  [93.4 kg] 93.4 kg (03/14 0516)  Weight change: 0 kg Filed Weights   11/20/22 1127 11/22/22 0516  Weight: 93.4 kg 93.4 kg    Intake/Output: I/O last 3 completed shifts: In: 4484.6 [P.O.:1500; I.V.:2836.3; IV Piggyback:148.4] Out: 4200 [Urine:4200]   Intake/Output this shift:  Total I/O In: 910 [P.O.:910] Out: 1200 [Urine:1200]  Physical Exam: General: NAD  Head: Normocephalic, atraumatic. Moist oral mucosal membranes  Eyes: Anicteric  Lungs:  Clear to auscultation, normal effort  Heart: Regular rate and rhythm  Abdomen:  Soft, nontender,   Extremities: No peripheral edema.  Neurologic: Nonfocal, moving all four extremities  Skin: No lesions  Access: None    Basic Metabolic Panel: Recent Labs  Lab 11/20/22 1212 11/20/22 1810 11/21/22 0500 11/21/22 0502 11/21/22 1337 11/22/22 0722  NA 116* 116*  --  118* 122* 120*  K 4.2 4.1  --  3.5 3.2* 3.3*  CL 87* 87*  --  90* 90* 91*  CO2 16* 16*  --  14* 19* 21*  GLUCOSE 101* 88  --  85 116* 91  BUN 63* 61*  --  53* 48* 28*  CREATININE 7.19* 6.78*  --  5.07* 3.57* 1.94*  CALCIUM 4.4* 4.7*  --  5.2* 6.0* 6.2*  MG 1.5*  --  2.5*  --   --  2.3  PHOS  --   --   --   --   --  2.6    Liver Function Tests: Recent Labs  Lab 11/20/22 1133 11/21/22 0500  AST 44* 35  ALT 25 21  ALKPHOS 86 84  BILITOT 1.1 1.1  PROT 6.9 5.9*  ALBUMIN 3.2* 2.5*   Recent Labs  Lab  11/20/22 1133 11/22/22 0722  LIPASE 185* 41   No results for input(s): "AMMONIA" in the last 168 hours.  CBC: Recent Labs  Lab 11/20/22 1133 11/21/22 0500 11/22/22 0722  WBC 14.5* 12.8* 16.7*  HGB 12.7* 11.5* 11.5*  HCT 35.9* 32.4* 32.4*  MCV 95.5 94.7 94.7  PLT 139* 129* 149*    Cardiac Enzymes: No results for input(s): "CKTOTAL", "CKMB", "CKMBINDEX", "TROPONINI" in the last 168 hours.  BNP: Invalid input(s): "POCBNP"  CBG: No results for input(s): "GLUCAP" in the last 168 hours.  Microbiology: Results for orders placed or performed during the hospital encounter of 11/20/22  Resp panel by RT-PCR (RSV, Flu A&B, Covid) Anterior Nasal Swab     Status: None   Collection Time: 11/20/22 12:01 PM   Specimen: Anterior Nasal Swab  Result Value Ref Range Status   SARS Coronavirus 2 by RT PCR NEGATIVE NEGATIVE Final    Comment: (NOTE) SARS-CoV-2 target nucleic acids are NOT DETECTED.  The SARS-CoV-2 RNA is generally detectable in upper respiratory specimens during the acute phase of infection. The lowest concentration of SARS-CoV-2 viral copies this assay can detect is 138 copies/mL. A negative result does not preclude SARS-Cov-2 infection and should not  be used as the sole basis for treatment or other patient management decisions. A negative result may occur with  improper specimen collection/handling, submission of specimen other than nasopharyngeal swab, presence of viral mutation(s) within the areas targeted by this assay, and inadequate number of viral copies(<138 copies/mL). A negative result must be combined with clinical observations, patient history, and epidemiological information. The expected result is Negative.  Fact Sheet for Patients:  EntrepreneurPulse.com.au  Fact Sheet for Healthcare Providers:  IncredibleEmployment.be  This test is no t yet approved or cleared by the [place] FDA and  has been authorized for  detection and/or diagnosis of SARS-CoV-2 by FDA under an Emergency Use Authorization (EUA). This EUA will remain  in effect (meaning this test can be used) for the duration of the COVID-19 declaration under Section 564(b)(1) of the Act, 21 U.S.C.section 360bbb-3(b)(1), unless the authorization is terminated  or revoked sooner.       Influenza A by PCR NEGATIVE NEGATIVE Final   Influenza B by PCR NEGATIVE NEGATIVE Final    Comment: (NOTE) The Xpert Xpress SARS-CoV-2/FLU/RSV plus assay is intended as an aid in the diagnosis of influenza from Nasopharyngeal swab specimens and should not be used as a sole basis for treatment. Nasal washings and aspirates are unacceptable for Xpert Xpress SARS-CoV-2/FLU/RSV testing.  Fact Sheet for Patients: EntrepreneurPulse.com.au  Fact Sheet for Healthcare Providers: IncredibleEmployment.be  This test is not yet approved or cleared by the [place] FDA and has been authorized for detection and/or diagnosis of SARS-CoV-2 by FDA under an Emergency Use Authorization (EUA). This EUA will remain in effect (meaning this test can be used) for the duration of the COVID-19 declaration under Section 564(b)(1) of the Act, 21 U.S.C. section 360bbb-3(b)(1), unless the authorization is terminated or revoked.     Resp Syncytial Virus by PCR NEGATIVE NEGATIVE Final    Comment: (NOTE) Fact Sheet for Patients: EntrepreneurPulse.com.au  Fact Sheet for Healthcare Providers: IncredibleEmployment.be  This test is not yet approved or cleared by the [place] FDA and has been authorized for detection and/or diagnosis of SARS-CoV-2 by FDA under an Emergency Use Authorization (EUA). This EUA will remain in effect (meaning this test can be used) for the duration of the COVID-19 declaration under Section 564(b)(1) of the Act, 21 U.S.C. section 360bbb-3(b)(1), unless the authorization is  terminated or revoked.  Performed at [facility], [address] [place], [place] [address]     Coagulation Studies: No results for input(s): "LABPROT", "INR" in the last 72 hours.  Urinalysis: Recent Labs    11/20/22 1200  COLORURINE YELLOW*  LABSPEC 1.011  PHURINE 5.0  GLUCOSEU 50*  HGBUR MODERATE*  BILIRUBINUR NEGATIVE  KETONESUR NEGATIVE  PROTEINUR 100*  NITRITE NEGATIVE  LEUKOCYTESUR NEGATIVE      Imaging: No results found.   Medications:    lactated ringers 100 mL/hr at 11/22/22 1321    diclofenac Sodium  4 g Topical QID   gabapentin  300 mg Oral TID   heparin  5,000 Units Subcutaneous Q8H   PARoxetine  30 mg Oral Daily   sodium chloride flush  3 mL Intravenous Q12H   acetaminophen **OR** acetaminophen, ALPRAZolam, HYDROcodone-acetaminophen, ondansetron **OR** ondansetron (ZOFRAN) IV  Assessment/ Plan:  Mr. Arthur Patterson is a 62 y.o.  male with past medical continues including hyperlipidemia, GERD, hypertension, lumbar disc disease, and diverticulosis, who was admitted to [facility] on 11/20/2022 for Hypocalcemia [E83.51] Nausea [R11.0] Acute renal failure ([place]) [N17.9] AKI (acute kidney injury) ([place]) [N17.9]  Acute kidney injury likely secondary to severe illness and hypovolemia.  Normal renal function noted on previous labs.  No IV contrast exposure.  Creatinine on ED arrival 7.82 with GFR 7.  Renal ultrasound negative for obstruction.   Creatinine continues to improve with IVF. Patient is outside of dialysis concern. Will continue to monitor renal function. Electrolytes should normalize as well.  Lab Results  Component Value Date   CREATININE 1.94 (H) 11/22/2022   CREATININE 3.57 (H) 11/21/2022   CREATININE 5.07 (H) 11/21/2022    Intake/Output Summary (Last 24 hours) at 11/22/2022 1621 Last data filed at 11/22/2022 1300 Gross per 24 hour  Intake 2034.63 ml  Output 2350 ml  Net -315.37 ml   2.  Severe hyponatremia, sodium on  admission 113. Will reduce LR infusion to 14m/hr. Sodium 120.    3.  Hypertension, essential.  Home regimen currently telmisartan-hydrochlorothiazide.  Currently held.  Blood pressure 143/103, will continue to hold antihypertensives for now.    LOS: 2   3/14/20244:21 PM

## 2022-11-23 ENCOUNTER — Encounter: Payer: Self-pay | Admitting: Internal Medicine

## 2022-11-23 ENCOUNTER — Inpatient Hospital Stay: Payer: 59

## 2022-11-23 DIAGNOSIS — N179 Acute kidney failure, unspecified: Secondary | ICD-10-CM | POA: Diagnosis not present

## 2022-11-23 LAB — BASIC METABOLIC PANEL
Anion gap: 14 (ref 5–15)
BUN: 15 mg/dL (ref 8–23)
CO2: 22 mmol/L (ref 22–32)
Calcium: 6.8 mg/dL — ABNORMAL LOW (ref 8.9–10.3)
Chloride: 88 mmol/L — ABNORMAL LOW (ref 98–111)
Creatinine, Ser: 1.27 mg/dL — ABNORMAL HIGH (ref 0.61–1.24)
GFR, Estimated: >60 mL/min (ref >60)
Glucose, Bld: 92 mg/dL (ref 70–99)
Potassium: 3.5 mmol/L (ref 3.5–5.1)
Sodium: 124 mmol/L — ABNORMAL LOW (ref 135–145)

## 2022-11-23 LAB — CBC
HCT: 31.4 % — ABNORMAL LOW (ref 39.0–52.0)
Hemoglobin: 11.1 g/dL — ABNORMAL LOW (ref 13.0–17.0)
MCH: 33.6 pg (ref 26.0–34.0)
MCHC: 35.4 g/dL (ref 30.0–36.0)
MCV: 95.2 fL (ref 80.0–100.0)
Platelets: 194 10*3/uL (ref 150–400)
RBC: 3.3 MIL/uL — ABNORMAL LOW (ref 4.22–5.81)
RDW: 13.5 % (ref 11.5–15.5)
WBC: 22.7 10*3/uL — ABNORMAL HIGH (ref 4.0–10.5)
nRBC: 0.1 % (ref 0.0–0.2)

## 2022-11-23 LAB — MAGNESIUM: Magnesium: 2.1 mg/dL (ref 1.7–2.4)

## 2022-11-23 LAB — PROCALCITONIN: Procalcitonin: 1.83 ng/mL

## 2022-11-23 MED ORDER — CALCIUM GLUCONATE-NACL 2-0.675 GM/100ML-% IV SOLN
2.0000 g | Freq: Once | INTRAVENOUS | Status: AC
  Administered 2022-11-23: 2000 mg via INTRAVENOUS
  Filled 2022-11-23: qty 100

## 2022-11-23 MED ORDER — IOHEXOL 9 MG/ML PO SOLN
500.0000 mL | ORAL | Status: AC
  Administered 2022-11-23 (×2): 500 mL via ORAL

## 2022-11-23 MED ORDER — IOHEXOL 300 MG/ML  SOLN
100.0000 mL | Freq: Once | INTRAMUSCULAR | Status: AC | PRN
  Administered 2022-11-23: 100 mL via INTRAVENOUS

## 2022-11-23 NOTE — Progress Notes (Signed)
[facility]  ROUNDING NOTE   Subjective:   Patient sitting up in chair Complaining of abd discomfort Reports constipation.   Creatinine 1.27 Urine output 1.8 L  Objective:  Vital signs in last 24 hours:  Temp:  [97.8 F (36.6 C)-99 F (37.2 C)] 98.1 F (36.7 C) (03/15 0924) Pulse Rate:  [99-109] 99 (03/15 0924) Resp:  [16-20] 18 (03/15 0924) BP: (111-149)/(80-90) 147/90 (03/15 0924) SpO2:  [96 %-99 %] 98 % (03/15 0924) Weight:  [98.7 kg] 98.7 kg (03/15 0837)  Weight change:  Filed Weights   11/20/22 1127 11/22/22 0516 11/23/22 0837  Weight: 93.4 kg 93.4 kg 98.7 kg    Intake/Output: I/O last 3 completed shifts: In: 2230 [P.O.:2230] Out: 2200 [Urine:2200]   Intake/Output this shift:  No intake/output data recorded.  Physical Exam: General: NAD  Head: Normocephalic, atraumatic. Moist oral mucosal membranes  Eyes: Anicteric  Lungs:  Clear to auscultation, normal effort  Heart: Regular rate and rhythm  Abdomen:  Soft, nontender  Extremities: No peripheral edema.  Neurologic: Nonfocal, moving all four extremities  Skin: No lesions  Access: None    Basic Metabolic Panel: Recent Labs  Lab 11/20/22 1212 11/20/22 1810 11/21/22 0500 11/21/22 0502 11/21/22 1337 11/22/22 0722 11/23/22 0543  NA 116* 116*  --  118* 122* 120* 124*  K 4.2 4.1  --  3.5 3.2* 3.3* 3.5  CL 87* 87*  --  90* 90* 91* 88*  CO2 16* 16*  --  14* 19* 21* 22  GLUCOSE 101* 88  --  85 116* 91 92  BUN 63* 61*  --  53* 48* 28* 15  CREATININE 7.19* 6.78*  --  5.07* 3.57* 1.94* 1.27*  CALCIUM 4.4* 4.7*  --  5.2* 6.0* 6.2* 6.8*  MG 1.5*  --  2.5*  --   --  2.3 2.1  PHOS  --   --   --   --   --  2.6  --      Liver Function Tests: Recent Labs  Lab 11/20/22 1133 11/21/22 0500  AST 44* 35  ALT 25 21  ALKPHOS 86 84  BILITOT 1.1 1.1  PROT 6.9 5.9*  ALBUMIN 3.2* 2.5*    Recent Labs  Lab 11/20/22 1133 11/22/22 0722  LIPASE 185* 41    No results for input(s): "AMMONIA"  in the last 168 hours.  CBC: Recent Labs  Lab 11/20/22 1133 11/21/22 0500 11/22/22 0722 11/23/22 0543  WBC 14.5* 12.8* 16.7* 22.7*  HGB 12.7* 11.5* 11.5* 11.1*  HCT 35.9* 32.4* 32.4* 31.4*  MCV 95.5 94.7 94.7 95.2  PLT 139* 129* 149* 194     Cardiac Enzymes: No results for input(s): "CKTOTAL", "CKMB", "CKMBINDEX", "TROPONINI" in the last 168 hours.  BNP: Invalid input(s): "POCBNP"  CBG: Recent Labs  Lab 11/22/22 1953  [place] 81*    Microbiology: Results for orders placed or performed during the hospital encounter of 11/20/22  Resp panel by RT-PCR (RSV, Flu A&B, Covid) Anterior Nasal Swab     Status: None   Collection Time: 11/20/22 12:01 PM   Specimen: Anterior Nasal Swab  Result Value Ref Range Status   SARS Coronavirus 2 by RT PCR NEGATIVE NEGATIVE Final    Comment: (NOTE) SARS-CoV-2 target nucleic acids are NOT DETECTED.  The SARS-CoV-2 RNA is generally detectable in upper respiratory specimens during the acute phase of infection. The lowest concentration of SARS-CoV-2 viral copies this assay can detect is 138 copies/mL. A negative result does not preclude SARS-Cov-2  infection and should not be used as the sole basis for treatment or other patient management decisions. A negative result may occur with  improper specimen collection/handling, submission of specimen other than nasopharyngeal swab, presence of viral mutation(s) within the areas targeted by this assay, and inadequate number of viral copies(<138 copies/mL). A negative result must be combined with clinical observations, patient history, and epidemiological information. The expected result is Negative.  Fact Sheet for Patients:  EntrepreneurPulse.com.au  Fact Sheet for Healthcare Providers:  IncredibleEmployment.be  This test is no t yet approved or cleared by the [place] FDA and  has been authorized for detection and/or diagnosis of SARS-CoV-2 by FDA  under an Emergency Use Authorization (EUA). This EUA will remain  in effect (meaning this test can be used) for the duration of the COVID-19 declaration under Section 564(b)(1) of the Act, 21 U.S.C.section 360bbb-3(b)(1), unless the authorization is terminated  or revoked sooner.       Influenza A by PCR NEGATIVE NEGATIVE Final   Influenza B by PCR NEGATIVE NEGATIVE Final    Comment: (NOTE) The Xpert Xpress SARS-CoV-2/FLU/RSV plus assay is intended as an aid in the diagnosis of influenza from Nasopharyngeal swab specimens and should not be used as a sole basis for treatment. Nasal washings and aspirates are unacceptable for Xpert Xpress SARS-CoV-2/FLU/RSV testing.  Fact Sheet for Patients: EntrepreneurPulse.com.au  Fact Sheet for Healthcare Providers: IncredibleEmployment.be  This test is not yet approved or cleared by the [place] FDA and has been authorized for detection and/or diagnosis of SARS-CoV-2 by FDA under an Emergency Use Authorization (EUA). This EUA will remain in effect (meaning this test can be used) for the duration of the COVID-19 declaration under Section 564(b)(1) of the Act, 21 U.S.C. section 360bbb-3(b)(1), unless the authorization is terminated or revoked.     Resp Syncytial Virus by PCR NEGATIVE NEGATIVE Final    Comment: (NOTE) Fact Sheet for Patients: EntrepreneurPulse.com.au  Fact Sheet for Healthcare Providers: IncredibleEmployment.be  This test is not yet approved or cleared by the [place] FDA and has been authorized for detection and/or diagnosis of SARS-CoV-2 by FDA under an Emergency Use Authorization (EUA). This EUA will remain in effect (meaning this test can be used) for the duration of the COVID-19 declaration under Section 564(b)(1) of the Act, 21 U.S.C. section 360bbb-3(b)(1), unless the authorization is terminated or revoked.  Performed at [facility], [address] [place], [place] [address]     Coagulation Studies: No results for input(s): "LABPROT", "INR" in the last 72 hours.  Urinalysis: No results for input(s): "COLORURINE", "LABSPEC", "PHURINE", "GLUCOSEU", "HGBUR", "BILIRUBINUR", "KETONESUR", "PROTEINUR", "UROBILINOGEN", "NITRITE", "LEUKOCYTESUR" in the last 72 hours.  Invalid input(s): "APPERANCEUR"     Imaging: CT ABDOMEN PELVIS W CONTRAST  Result Date: 11/23/2022 CLINICAL DATA:  Pancreatitis with worsening leukocytosis. EXAM: CT ABDOMEN AND PELVIS WITH CONTRAST TECHNIQUE: Multidetector CT imaging of the abdomen and pelvis was performed using the standard protocol following bolus administration of intravenous contrast. RADIATION DOSE REDUCTION: This exam was performed according to the departmental dose-optimization program which includes automated exposure control, adjustment of the mA and/or kV according to patient size and/or use of iterative reconstruction technique. CONTRAST:  161mL OMNIPAQUE IOHEXOL 300 MG/ML  SOLN COMPARISON:  CT abdomen pelvis dated 11/20/2022. FINDINGS: Lower chest: There is a moderate left pleural effusion with associated atelectasis. Hepatobiliary: The liver is diffusely hypoattenuating, consistent with hepatic steatosis no focal liver abnormality is seen. No gallstones, gallbladder wall thickening, or biliary dilatation. Pancreas:  Significant peripancreatic inflammatory changes appear mildly increased compared to 11/20/2022. There is an approximately 3 cm area of apparent non enhancement involving the pancreatic body (series 2, images 20 9-30 and series 6 images 47-49). These findings are consistent with acute necrotizing pancreatitis. The prior exam was a noncontrast exam and therefore it is unclear if there was an area of non enhancement on 11/20/2022. No definite evidence of acute necrotic collections. No soft tissue gas seen in the pancreatic bed suggest infected necrosis. Spleen: Normal  in size without focal abnormality. Adrenals/Urinary Tract: Adrenal glands are unremarkable. Kidneys are normal, without renal calculi, focal lesion, or hydronephrosis. Bladder is unremarkable. Stomach/Bowel: Stomach is within normal limits. Enteric contrast reaches the distal ileum. The appendix is not definitely identified, however no pericecal inflammatory changes are noted to suggest acute appendicitis. There is colonic diverticulosis without evidence of diverticulitis. No evidence of bowel wall thickening, distention, or inflammatory changes. Vascular/Lymphatic: No significant vascular findings are present. No evidence of splenic vein thrombosis or splenic artery pseudoaneurysm. No enlarged abdominal or pelvic lymph nodes. Reproductive: Prostate is unremarkable. Other: No abdominal wall hernia or abnormality. No abdominopelvic ascites. Musculoskeletal: Degenerative changes are seen in the spine. IMPRESSION: 1. Acute necrotizing pancreatitis with an approximately 3 cm area of non enhancement involving the pancreatic body. The degree of peripancreatic inflammatory changes is slightly increased since 11/20/2022. No definite acute necrotic collections or findings to suggest infected necrosis. 2. Moderate left pleural effusion with associated atelectasis. 3. Hepatic steatosis. Electronically Signed   By: Zerita Boers M.D.   On: 11/23/2022 13:15     Medications:    lactated ringers Stopped (11/23/22 0845)    diclofenac Sodium  4 g Topical QID   gabapentin  300 mg Oral TID   heparin  5,000 Units Subcutaneous Q8H   PARoxetine  30 mg Oral Daily   polyethylene glycol  34 g Oral BID   sodium chloride flush  3 mL Intravenous Q12H   acetaminophen **OR** acetaminophen, ALPRAZolam, HYDROcodone-acetaminophen, ondansetron **OR** ondansetron (ZOFRAN) IV  Assessment/ Plan:  Mr. Arthur Patterson is a 62 y.o.  male with past medical continues including hyperlipidemia, GERD, hypertension, lumbar disc disease, and  diverticulosis, who was admitted to [facility] on 11/20/2022 for Hypocalcemia [E83.51] Nausea [R11.0] Acute renal failure ([place]) [N17.9] AKI (acute kidney injury) ([place]) [N17.9]    Acute kidney injury likely secondary to severe illness and hypovolemia.  Normal renal function noted on previous labs.  No IV contrast exposure.  Creatinine on ED arrival 7.82 with GFR 7.  Renal ultrasound negative for obstruction.   Renal function improving with IVF. Appetite appropriate. Continue IVF for an additional day.   Lab Results  Component Value Date   CREATININE 1.27 (H) 11/23/2022   CREATININE 1.94 (H) 11/22/2022   CREATININE 3.57 (H) 11/21/2022    Intake/Output Summary (Last 24 hours) at 11/23/2022 1457 Last data filed at 11/23/2022 0400 Gross per 24 hour  Intake 1080 ml  Output 600 ml  Net 480 ml    2.  Severe hyponatremia, sodium on admission 113. Continue LR at 123ml/hr. Sodium now 124.    3.  Hypertension, essential.  Home regimen currently telmisartan-hydrochlorothiazide.  Currently held.  Blood pressure acceptable for this patient. Will continue to monitor.     LOS: 3 [place] 3/15/20242:57 PM

## 2022-11-23 NOTE — Progress Notes (Signed)
PROGRESS NOTE    [name]  [id number] DOB: 10/05/1960 DOA: 11/20/2022 PCP: Rusty Aus, MD  250A/250A-AA  LOS: 3 days   Brief hospital course:   Assessment & Plan: Arthur Patterson is a 62 y.o. male with medical history significant of hypertension, hyperlipidemia, lumbar disc disease, GERD, diverticulosis who presents to the ED due to abdominal pain.   Arthur Patterson states that on 11/16/2022, he began to experience generalized malaise and nausea.  The next day, he began to have multiple episodes of nonbilious, nonbloody vomit.  He states that he has had at least 5+ episodes of vomiting per day since then.  In addition, he has had diarrhea intermittently with the most recent episode yesterday.  Arthur Patterson states that he was taking meloxicam nearly every day for several months but his last dose was on 3/6. He has been still taking his home antihypertensives. He denies any daily alcohol use.    * Acute renal failure [facility]) Patient presenting with creatinine of 7.8, previously within normal limits.  Most likely secondary to significant hypovolemia in the setting of acute pancreatitis, and N/V/D.  Last BMP obtained 1 week ago prior to symptom onset with creatinine of 1.06.  - Nephrology consulted; appreciate their recommendations --Cr improving with IVF --cont LR@100   Acute pancreatitis with developing necrosis Patient presenting with nausea and vomiting for 3 to 4 days in the setting of elevated lipase and evidence of markedly inflamed pancreas on CT imaging.  Etiology uncertain at this time as patient declines any recent alcohol use and no gallstones or gallbladder thickening seen on CT imaging and no biliary dilation.  Triglycerides 199 on presentation.  Only medication he takes that is known to cause pancreatitis is simvastatin. --cont LR@100  --since no signs of infection currently, no need for ppx abx, agreed with curbside GI. --If fever develops, then obtain blood cx and start  meropenem.  Hyponatremia 2/2 hypovolemia Na 113 on presentation.  In the setting of profound dehydration and acute pancreatitis.   --per nephro, no risk for over-correction since hyponatremia was due to hypovolemia. --cont LR@100   Hypocalcemia Likely secondary to pancreatitis.  No EKG changes or tetany on exam. --s/p IV cal gluconate x7g --IV cal x 2g today  Hypomag --monitor and replete PRN  Anxiety - Continue home paroxetine and Xanax as needed  Essential hypertension - Hold home antihypertensives in the setting of AKI  OSA (obstructive sleep apnea) - CPAP at bedtime  Chronic back pain --worsened since presentation --Norco PRN --cont home gabapentin (monitor for sedation) --Voltaren gel   DVT prophylaxis: Heparin SQ Code Status: Full code  Family Communication: wife updated at bedside today Level of care: Progressive Dispo:   The patient is from: home Anticipated d/c is to: home Anticipated d/c date is: 2-3 days   Subjective and Interval History:  Back pain controlled.  No BM yet, pt reported feeling bloated.  WBC went up this morning.  Repeat CT scan showed necrotizing pancreatitis, but no signs of infection.   Objective: Vitals:   11/23/22 0425 11/23/22 0837 11/23/22 0924 11/23/22 1638  BP: (!) 149/90  (!) 147/90 (!) 151/94  Pulse: (!) 106  99 100  Resp: 20  18 20   Temp: 97.8 F (36.6 C)  98.1 F (36.7 C) 98.4 F (36.9 C)  TempSrc: Oral  Oral   SpO2: 99%  98% 98%  Weight:  98.7 kg    Height:        Intake/Output Summary (Last 24 hours)  at 11/23/2022 1741 Last data filed at 11/23/2022 1459 Gross per 24 hour  Intake 6169.13 ml  Output 600 ml  Net 5569.13 ml   Filed Weights   11/20/22 1127 11/22/22 0516 11/23/22 0837  Weight: 93.4 kg 93.4 kg 98.7 kg    Examination:   Constitutional: NAD, AAOx3 HEENT: conjunctivae and lids normal, EOMI CV: No cyanosis.   RESP: normal respiratory effort, on RA GI: abdomen distended Neuro: II - XII grossly  intact.   Psych: Normal mood and affect.  Appropriate judgement and reason   Data Reviewed: I have personally reviewed labs and imaging studies  Time spent: 50 minutes  Enzo Bi, MD [facility] If 7PM-7AM, please contact night-coverage 11/23/2022, 5:41 PM

## 2022-11-24 DIAGNOSIS — N179 Acute kidney failure, unspecified: Secondary | ICD-10-CM | POA: Diagnosis not present

## 2022-11-24 LAB — BASIC METABOLIC PANEL
Anion gap: 10 (ref 5–15)
BUN: 10 mg/dL (ref 8–23)
CO2: 23 mmol/L (ref 22–32)
Calcium: 7 mg/dL — ABNORMAL LOW (ref 8.9–10.3)
Chloride: 87 mmol/L — ABNORMAL LOW (ref 98–111)
Creatinine, Ser: 1.01 mg/dL (ref 0.61–1.24)
GFR, Estimated: >60 mL/min (ref >60)
Glucose, Bld: 95 mg/dL (ref 70–99)
Potassium: 2.9 mmol/L — ABNORMAL LOW (ref 3.5–5.1)
Sodium: 120 mmol/L — ABNORMAL LOW (ref 135–145)

## 2022-11-24 LAB — CBC
HCT: 29.7 % — ABNORMAL LOW (ref 39.0–52.0)
Hemoglobin: 10.4 g/dL — ABNORMAL LOW (ref 13.0–17.0)
MCH: 32.9 pg (ref 26.0–34.0)
MCHC: 35 g/dL (ref 30.0–36.0)
MCV: 94 fL (ref 80.0–100.0)
Platelets: 236 10*3/uL (ref 150–400)
RBC: 3.16 MIL/uL — ABNORMAL LOW (ref 4.22–5.81)
RDW: 13.5 % (ref 11.5–15.5)
WBC: 23.6 10*3/uL — ABNORMAL HIGH (ref 4.0–10.5)
nRBC: 0 % (ref 0.0–0.2)

## 2022-11-24 LAB — CALCIUM, IONIZED: Calcium, Ionized, Serum: 3.5 mg/dL — ABNORMAL LOW (ref 4.5–5.6)

## 2022-11-24 LAB — MAGNESIUM: Magnesium: 1.9 mg/dL (ref 1.7–2.4)

## 2022-11-24 MED ORDER — ALPRAZOLAM 0.25 MG PO TABS
0.2500 mg | ORAL_TABLET | Freq: Once | ORAL | Status: AC
  Administered 2022-11-24: 0.25 mg via ORAL
  Filled 2022-11-24: qty 1

## 2022-11-24 MED ORDER — HYDRALAZINE HCL 20 MG/ML IJ SOLN
10.0000 mg | Freq: Four times a day (QID) | INTRAMUSCULAR | Status: DC | PRN
  Administered 2022-11-24: 10 mg via INTRAVENOUS
  Filled 2022-11-24: qty 1

## 2022-11-24 MED ORDER — HYDRALAZINE HCL 50 MG PO TABS
50.0000 mg | ORAL_TABLET | Freq: Three times a day (TID) | ORAL | Status: DC
  Administered 2022-11-24 – 2022-11-25 (×5): 50 mg via ORAL
  Filled 2022-11-24 (×5): qty 1

## 2022-11-24 MED ORDER — CALCIUM GLUCONATE-NACL 2-0.675 GM/100ML-% IV SOLN
2.0000 g | Freq: Once | INTRAVENOUS | Status: AC
  Administered 2022-11-24: 2000 mg via INTRAVENOUS
  Filled 2022-11-24: qty 100

## 2022-11-24 MED ORDER — CALCIUM CARBONATE 1250 (500 CA) MG PO TABS
1.0000 | ORAL_TABLET | Freq: Three times a day (TID) | ORAL | Status: DC
  Administered 2022-11-24 – 2022-12-02 (×24): 1250 mg via ORAL
  Filled 2022-11-24 (×23): qty 1

## 2022-11-24 MED ORDER — POTASSIUM CHLORIDE CRYS ER 20 MEQ PO TBCR
40.0000 meq | EXTENDED_RELEASE_TABLET | ORAL | Status: AC
  Administered 2022-11-24 (×2): 40 meq via ORAL
  Filled 2022-11-24 (×2): qty 2

## 2022-11-24 MED ORDER — SODIUM CHLORIDE 1 G PO TABS
1.0000 g | ORAL_TABLET | Freq: Three times a day (TID) | ORAL | Status: DC
  Administered 2022-11-24 – 2022-11-28 (×11): 1 g via ORAL
  Filled 2022-11-24 (×12): qty 1

## 2022-11-24 NOTE — Progress Notes (Addendum)
[facility]  ROUNDING NOTE   Subjective:   Patient seen resting in bed, wife at bedside Reports bowel movement yesterday Requesting discharge States he is unable to rest well during this admission  Creatinine 1.01   Objective:  Vital signs in last 24 hours:  Temp:  [98 F (36.7 C)-98.9 F (37.2 C)] 98.6 F (37 C) (03/16 0724) Pulse Rate:  [96-104] 99 (03/16 0911) Resp:  [18-20] 18 (03/16 0724) BP: (135-181)/(87-114) 135/87 (03/16 0911) SpO2:  [97 %-100 %] 97 % (03/16 0724)  Weight change:  Filed Weights   11/20/22 1127 11/22/22 0516 11/23/22 0837  Weight: 93.4 kg 93.4 kg 98.7 kg    Intake/Output: I/O last 3 completed shifts: In: 5929.1 [P.O.:840; I.V.:4989.1; IV Piggyback:100] Out: 600 [Urine:600]   Intake/Output this shift:  No intake/output data recorded.  Physical Exam: General: NAD  Head: Normocephalic, atraumatic. Moist oral mucosal membranes  Eyes: Anicteric  Lungs:  Clear to auscultation, normal effort  Heart: Regular rate and rhythm  Abdomen:  Soft, nontender  Extremities: No peripheral edema.  Neurologic: Nonfocal, moving all four extremities  Skin: No lesions  Access: None    Basic Metabolic Panel: Recent Labs  Lab 11/20/22 1212 11/20/22 1810 11/21/22 0500 11/21/22 0502 11/21/22 1337 11/22/22 0722 11/23/22 0543 11/24/22 0511  NA 116*   < >  --  118* 122* 120* 124* 120*  K 4.2   < >  --  3.5 3.2* 3.3* 3.5 2.9*  CL 87*   < >  --  90* 90* 91* 88* 87*  CO2 16*   < >  --  14* 19* 21* 22 23  GLUCOSE 101*   < >  --  85 116* 91 92 95  BUN 63*   < >  --  53* 48* 28* 15 10  CREATININE 7.19*   < >  --  5.07* 3.57* 1.94* 1.27* 1.01  CALCIUM 4.4*   < >  --  5.2* 6.0* 6.2* 6.8* 7.0*  MG 1.5*  --  2.5*  --   --  2.3 2.1 1.9  PHOS  --   --   --   --   --  2.6  --   --    < > = values in this interval not displayed.     Liver Function Tests: Recent Labs  Lab 11/20/22 1133 11/21/22 0500  AST 44* 35  ALT 25 21  ALKPHOS 86 84   BILITOT 1.1 1.1  PROT 6.9 5.9*  ALBUMIN 3.2* 2.5*    Recent Labs  Lab 11/20/22 1133 11/22/22 0722  LIPASE 185* 41    No results for input(s): "AMMONIA" in the last 168 hours.  CBC: Recent Labs  Lab 11/20/22 1133 11/21/22 0500 11/22/22 0722 11/23/22 0543 11/24/22 0511  WBC 14.5* 12.8* 16.7* 22.7* 23.6*  HGB 12.7* 11.5* 11.5* 11.1* 10.4*  HCT 35.9* 32.4* 32.4* 31.4* 29.7*  MCV 95.5 94.7 94.7 95.2 94.0  PLT 139* 129* 149* 194 236     Cardiac Enzymes: No results for input(s): "CKTOTAL", "CKMB", "CKMBINDEX", "TROPONINI" in the last 168 hours.  BNP: Invalid input(s): "POCBNP"  CBG: Recent Labs  Lab 11/22/22 1953  GLUCAP 65*     Microbiology: Results for orders placed or performed during the hospital encounter of 11/20/22  Resp panel by RT-PCR (RSV, Flu A&B, Covid) Anterior Nasal Swab     Status: None   Collection Time: 11/20/22 12:01 PM   Specimen: Anterior Nasal Swab  Result Value Ref Range Status  SARS Coronavirus 2 by RT PCR NEGATIVE NEGATIVE Final    Comment: (NOTE) SARS-CoV-2 target nucleic acids are NOT DETECTED.  The SARS-CoV-2 RNA is generally detectable in upper respiratory specimens during the acute phase of infection. The lowest concentration of SARS-CoV-2 viral copies this assay can detect is 138 copies/mL. A negative result does not preclude SARS-Cov-2 infection and should not be used as the sole basis for treatment or other patient management decisions. A negative result may occur with  improper specimen collection/handling, submission of specimen other than nasopharyngeal swab, presence of viral mutation(s) within the areas targeted by this assay, and inadequate number of viral copies(<138 copies/mL). A negative result must be combined with clinical observations, patient history, and epidemiological information. The expected result is Negative.  Fact Sheet for Patients:  EntrepreneurPulse.com.au  Fact Sheet for  Healthcare Providers:  IncredibleEmployment.be  This test is no t yet approved or cleared by the [place] FDA and  has been authorized for detection and/or diagnosis of SARS-CoV-2 by FDA under an Emergency Use Authorization (EUA). This EUA will remain  in effect (meaning this test can be used) for the duration of the COVID-19 declaration under Section 564(b)(1) of the Act, 21 U.S.C.section 360bbb-3(b)(1), unless the authorization is terminated  or revoked sooner.       Influenza A by PCR NEGATIVE NEGATIVE Final   Influenza B by PCR NEGATIVE NEGATIVE Final    Comment: (NOTE) The Xpert Xpress SARS-CoV-2/FLU/RSV plus assay is intended as an aid in the diagnosis of influenza from Nasopharyngeal swab specimens and should not be used as a sole basis for treatment. Nasal washings and aspirates are unacceptable for Xpert Xpress SARS-CoV-2/FLU/RSV testing.  Fact Sheet for Patients: EntrepreneurPulse.com.au  Fact Sheet for Healthcare Providers: IncredibleEmployment.be  This test is not yet approved or cleared by the [place] FDA and has been authorized for detection and/or diagnosis of SARS-CoV-2 by FDA under an Emergency Use Authorization (EUA). This EUA will remain in effect (meaning this test can be used) for the duration of the COVID-19 declaration under Section 564(b)(1) of the Act, 21 U.S.C. section 360bbb-3(b)(1), unless the authorization is terminated or revoked.     Resp Syncytial Virus by PCR NEGATIVE NEGATIVE Final    Comment: (NOTE) Fact Sheet for Patients: EntrepreneurPulse.com.au  Fact Sheet for Healthcare Providers: IncredibleEmployment.be  This test is not yet approved or cleared by the [place] FDA and has been authorized for detection and/or diagnosis of SARS-CoV-2 by FDA under an Emergency Use Authorization (EUA). This EUA will remain in effect (meaning this  test can be used) for the duration of the COVID-19 declaration under Section 564(b)(1) of the Act, 21 U.S.C. section 360bbb-3(b)(1), unless the authorization is terminated or revoked.  Performed at [facility], [address] [place], [place] [address]     Coagulation Studies: No results for input(s): "LABPROT", "INR" in the last 72 hours.  Urinalysis: No results for input(s): "COLORURINE", "LABSPEC", "PHURINE", "GLUCOSEU", "HGBUR", "BILIRUBINUR", "KETONESUR", "PROTEINUR", "UROBILINOGEN", "NITRITE", "LEUKOCYTESUR" in the last 72 hours.  Invalid input(s): "APPERANCEUR"     Imaging: CT ABDOMEN PELVIS W CONTRAST  Result Date: 11/23/2022 CLINICAL DATA:  Pancreatitis with worsening leukocytosis. EXAM: CT ABDOMEN AND PELVIS WITH CONTRAST TECHNIQUE: Multidetector CT imaging of the abdomen and pelvis was performed using the standard protocol following bolus administration of intravenous contrast. RADIATION DOSE REDUCTION: This exam was performed according to the departmental dose-optimization program which includes automated exposure control, adjustment of the mA and/or kV according to patient size and/or  use of iterative reconstruction technique. CONTRAST:  18mL OMNIPAQUE IOHEXOL 300 MG/ML  SOLN COMPARISON:  CT abdomen pelvis dated 11/20/2022. FINDINGS: Lower chest: There is a moderate left pleural effusion with associated atelectasis. Hepatobiliary: The liver is diffusely hypoattenuating, consistent with hepatic steatosis no focal liver abnormality is seen. No gallstones, gallbladder wall thickening, or biliary dilatation. Pancreas: Significant peripancreatic inflammatory changes appear mildly increased compared to 11/20/2022. There is an approximately 3 cm area of apparent non enhancement involving the pancreatic body (series 2, images 20 9-30 and series 6 images 47-49). These findings are consistent with acute necrotizing pancreatitis. The prior exam was a noncontrast exam and  therefore it is unclear if there was an area of non enhancement on 11/20/2022. No definite evidence of acute necrotic collections. No soft tissue gas seen in the pancreatic bed suggest infected necrosis. Spleen: Normal in size without focal abnormality. Adrenals/Urinary Tract: Adrenal glands are unremarkable. Kidneys are normal, without renal calculi, focal lesion, or hydronephrosis. Bladder is unremarkable. Stomach/Bowel: Stomach is within normal limits. Enteric contrast reaches the distal ileum. The appendix is not definitely identified, however no pericecal inflammatory changes are noted to suggest acute appendicitis. There is colonic diverticulosis without evidence of diverticulitis. No evidence of bowel wall thickening, distention, or inflammatory changes. Vascular/Lymphatic: No significant vascular findings are present. No evidence of splenic vein thrombosis or splenic artery pseudoaneurysm. No enlarged abdominal or pelvic lymph nodes. Reproductive: Prostate is unremarkable. Other: No abdominal wall hernia or abnormality. No abdominopelvic ascites. Musculoskeletal: Degenerative changes are seen in the spine. IMPRESSION: 1. Acute necrotizing pancreatitis with an approximately 3 cm area of non enhancement involving the pancreatic body. The degree of peripancreatic inflammatory changes is slightly increased since 11/20/2022. No definite acute necrotic collections or findings to suggest infected necrosis. 2. Moderate left pleural effusion with associated atelectasis. 3. Hepatic steatosis. Electronically Signed   By: Zerita Boers M.D.   On: 11/23/2022 13:15     Medications:    calcium gluconate     lactated ringers 100 mL/hr at 11/24/22 0142    diclofenac Sodium  4 g Topical QID   gabapentin  300 mg Oral TID   heparin  5,000 Units Subcutaneous Q8H   hydrALAZINE  50 mg Oral Q8H   PARoxetine  30 mg Oral Daily   polyethylene glycol  34 g Oral BID   potassium chloride  40 mEq Oral Q4H   sodium chloride  flush  3 mL Intravenous Q12H   acetaminophen **OR** acetaminophen, ALPRAZolam, hydrALAZINE, HYDROcodone-acetaminophen, ondansetron **OR** ondansetron (ZOFRAN) IV  Assessment/ Plan:  Mr. Arthur Patterson is a 62 y.o.  male with past medical continues including hyperlipidemia, GERD, hypertension, lumbar disc disease, and diverticulosis, who was admitted to [facility] on 11/20/2022 for Hypocalcemia [E83.51] Nausea [R11.0] Acute renal failure ([place]) [N17.9] AKI (acute kidney injury) ([place]) [N17.9]    Acute kidney injury likely secondary to severe illness and hypovolemia.  Normal renal function noted on previous labs.  No IV contrast exposure.  Creatinine on ED arrival 7.82 with GFR 7.  Renal ultrasound negative for obstruction.   Renal function has returned to baseline.  Appetite appropriate, will continue fluids for right.  Lab Results  Component Value Date   CREATININE 1.01 11/24/2022   CREATININE 1.27 (H) 11/23/2022   CREATININE 1.94 (H) 11/22/2022    Intake/Output Summary (Last 24 hours) at 11/24/2022 1052 Last data filed at 11/23/2022 1459 Gross per 24 hour  Intake 5089.13 ml  Output --  Net 5089.13 ml    2.  Severe hyponatremia, sodium on admission 113. Continue LR at 180ml/hr. Sodium 120. Will order salt tabs 1g three times a day.   3.  Hypertension, essential.  Home regimen currently telmisartan-hydrochlorothiazide.  Currently held.  Blood pressure stable    LOS: 4   3/16/202410:52 AM

## 2022-11-24 NOTE — Progress Notes (Signed)
Pt c/o inability to sleep and requested a dose of Xanax. Pt had already used his dose in am. He requested extra dose. Provider contacted and one dose ordered and given. Will continue plan of care.

## 2022-11-24 NOTE — Plan of Care (Signed)

## 2022-11-24 NOTE — Progress Notes (Signed)
PROGRESS NOTE    Arthur Patterson  [id number] DOB: [birth year] DOA: 11/20/2022 PCP: Rusty Aus, MD  250A/250A-AA  LOS: 4 days   Brief hospital course:   Assessment & Plan: Arthur Patterson is a 62 y.o. male with medical history significant of hypertension, hyperlipidemia, lumbar disc disease, GERD, diverticulosis who presents to the ED due to abdominal pain.   Arthur Patterson states that on 11/16/2022, he began to experience generalized malaise and nausea.  The next day, he began to have multiple episodes of nonbilious, nonbloody vomit.  He states that he has had at least 5+ episodes of vomiting per day since then.  In addition, he has had diarrhea intermittently with the most recent episode yesterday.  Arthur Patterson states that he was taking meloxicam nearly every day for several months but his last dose was on 3/6. He has been still taking his home antihypertensives. He denies any daily alcohol use.    * Acute renal failure [facility]) Patient presenting with creatinine of 7.8, previously within normal limits.  Most likely secondary to significant hypovolemia in the setting of acute pancreatitis, and N/V/D.  Last BMP obtained 1 week ago prior to symptom onset with creatinine of 1.06.  - Nephrology consulted; appreciate their recommendations --Cr improving with IVF --cont LR@100   Acute pancreatitis with developing necrosis Patient presenting with nausea and vomiting for 3 to 4 days in the setting of elevated lipase and evidence of markedly inflamed pancreas on CT imaging.  Etiology uncertain at this time as patient declines any recent alcohol use and no gallstones or gallbladder thickening seen on CT imaging and no biliary dilation.  Triglycerides 199 on presentation.  Only medication he takes that is known to cause pancreatitis is simvastatin. --cont LR@100  --since no signs of infection currently, no need for ppx abx, agreed with curbside GI. --If fever develops, then obtain blood cx and start  meropenem. --repeat CT scan in 4 weeks  Hyponatremia 2/2 hypovolemia Na 113 on presentation.  In the setting of profound dehydration and acute pancreatitis.   --per nephro, no risk for over-correction since hyponatremia was due to hypovolemia. --cont LR@100   Hypocalcemia Likely secondary to pancreatitis.  No EKG changes or tetany on exam. --s/p IV cal gluconate x9g --another IV cal x2 g today  Hypomag --monitor and replete PRN  Anxiety - Continue home paroxetine and Xanax as needed  Essential hypertension - Hold home telmisartan-HCTZ in the setting of AKI --start hydralazine 50 q8h  --IV hydralazine PRN  OSA (obstructive sleep apnea) - CPAP at bedtime  Chronic back pain --worsened since presentation --Norco PRN --cont home gabapentin (monitor for sedation) --Voltaren gel   DVT prophylaxis: Heparin SQ Code Status: Full code  Family Communication:  Level of care: Med-Surg Dispo:   The patient is from: home Anticipated d/c is to: home Anticipated d/c date is: 2-3 days   Subjective and Interval History:  Pt had BM.  No new complaints, eager to go home.  WBC still trending up, Na still low.     Objective: Vitals:   11/24/22 0911 11/24/22 1116 11/24/22 1422 11/24/22 1519  BP: 135/87 (!) 143/90 (!) 155/103 (!) 146/91  Pulse: 99 100 (!) 101 (!) 103  Resp:  18 16 18   Temp:  98.3 F (36.8 C) 98.3 F (36.8 C) 98 F (36.7 C)  TempSrc:      SpO2:  97% 98% 96%  Weight:      Height:        Intake/Output Summary (  Last 24 hours) at 11/24/2022 1638 Last data filed at 11/24/2022 1500 Gross per 24 hour  Intake 2849.99 ml  Output --  Net 2849.99 ml   Filed Weights   11/20/22 1127 11/22/22 0516 11/23/22 0837  Weight: 93.4 kg 93.4 kg 98.7 kg    Examination:   Constitutional: NAD, AAOx3 HEENT: conjunctivae and lids normal, EOMI CV: No cyanosis.   RESP: normal respiratory effort, on RA Neuro: II - XII grossly intact.   Psych: Normal mood and affect.   Appropriate judgement and reason   Data Reviewed: I have personally reviewed labs and imaging studies  Time spent: 35 minutes  Enzo Bi, MD [facility] If 7PM-7AM, please contact night-coverage 11/24/2022, 4:38 PM

## 2022-11-25 DIAGNOSIS — N179 Acute kidney failure, unspecified: Secondary | ICD-10-CM | POA: Diagnosis not present

## 2022-11-25 LAB — CBC
HCT: 30.7 % — ABNORMAL LOW (ref 39.0–52.0)
Hemoglobin: 10.7 g/dL — ABNORMAL LOW (ref 13.0–17.0)
MCH: 32.8 pg (ref 26.0–34.0)
MCHC: 34.9 g/dL (ref 30.0–36.0)
MCV: 94.2 fL (ref 80.0–100.0)
Platelets: 296 10*3/uL (ref 150–400)
RBC: 3.26 MIL/uL — ABNORMAL LOW (ref 4.22–5.81)
RDW: 14 % (ref 11.5–15.5)
WBC: 21.7 10*3/uL — ABNORMAL HIGH (ref 4.0–10.5)
nRBC: 0 % (ref 0.0–0.2)

## 2022-11-25 LAB — BASIC METABOLIC PANEL
Anion gap: 12 (ref 5–15)
BUN: 10 mg/dL (ref 8–23)
CO2: 24 mmol/L (ref 22–32)
Calcium: 7.4 mg/dL — ABNORMAL LOW (ref 8.9–10.3)
Chloride: 84 mmol/L — ABNORMAL LOW (ref 98–111)
Creatinine, Ser: 0.85 mg/dL (ref 0.61–1.24)
GFR, Estimated: >60 mL/min (ref >60)
Glucose, Bld: 88 mg/dL (ref 70–99)
Potassium: 3.1 mmol/L — ABNORMAL LOW (ref 3.5–5.1)
Sodium: 120 mmol/L — ABNORMAL LOW (ref 135–145)

## 2022-11-25 LAB — MAGNESIUM: Magnesium: 1.7 mg/dL (ref 1.7–2.4)

## 2022-11-25 MED ORDER — HYDRALAZINE HCL 50 MG PO TABS
75.0000 mg | ORAL_TABLET | Freq: Three times a day (TID) | ORAL | Status: DC
  Administered 2022-11-25 – 2022-11-26 (×3): 75 mg via ORAL
  Filled 2022-11-25 (×3): qty 1

## 2022-11-25 MED ORDER — POTASSIUM CHLORIDE CRYS ER 20 MEQ PO TBCR
40.0000 meq | EXTENDED_RELEASE_TABLET | Freq: Once | ORAL | Status: AC
  Administered 2022-11-25: 40 meq via ORAL
  Filled 2022-11-25: qty 2

## 2022-11-25 MED ORDER — POTASSIUM CHLORIDE 10 MEQ/100ML IV SOLN: 10.0000 meq | INTRAVENOUS | Status: DC

## 2022-11-25 MED ORDER — CALCIUM GLUCONATE-NACL 2-0.675 GM/100ML-% IV SOLN
2.0000 g | Freq: Once | INTRAVENOUS | Status: AC
  Administered 2022-11-25: 2000 mg via INTRAVENOUS
  Filled 2022-11-25: qty 100

## 2022-11-25 NOTE — Progress Notes (Signed)
PROGRESS NOTE    Arthur Patterson  [id number] DOB: 12-18-1960 DOA: 11/20/2022 PCP: Rusty Aus, MD  222A/222A-AA  LOS: 5 days   Brief hospital course:   Assessment & Plan: Arthur Patterson is a 62 y.o. male with medical history significant of hypertension, hyperlipidemia, lumbar disc disease, GERD, diverticulosis who presents to the ED due to abdominal pain.   Arthur Patterson states that on 11/16/2022, he began to experience generalized malaise and nausea.  The next day, he began to have multiple episodes of nonbilious, nonbloody vomit.  He states that he has had at least 5+ episodes of vomiting per day since then.  In addition, he has had diarrhea intermittently with the most recent episode yesterday.  Arthur Patterson states that he was taking meloxicam nearly every day for several months but his last dose was on 3/6. He has been still taking his home antihypertensives. He denies any daily alcohol use.    * Acute renal failure [facility]) Patient presenting with creatinine of 7.8, previously within normal limits.  Most likely secondary to significant hypovolemia in the setting of acute pancreatitis, and N/V/D.  Last BMP obtained 1 week ago prior to symptom onset with creatinine of 1.06.  - Nephrology consulted; appreciate their recommendations --Cr improving with IVF, now back to baseline 0.85  Acute pancreatitis with developing necrosis Patient presenting with nausea and vomiting for 3 to 4 days in the setting of elevated lipase and evidence of markedly inflamed pancreas on CT imaging.  Etiology uncertain at this time as patient declines any recent alcohol use and no gallstones or gallbladder thickening seen on CT imaging and no biliary dilation.  Triglycerides 199 on presentation.  Only medication he takes that is known to cause pancreatitis is simvastatin. --cont LR@100  --since no signs of infection currently, no need for ppx abx, agreed with curbside GI. --If fever develops, then obtain blood cx  and start meropenem. --repeat CT scan in 4 weeks  Hyponatremia 2/2 hypovolemia Na 113 on presentation.  In the setting of profound dehydration and acute pancreatitis.   --per nephro, no risk for over-correction since hyponatremia was due to hypovolemia. --cont LR@100  --cont salt tablets   Hypocalcemia Likely secondary to pancreatitis.  No EKG changes or tetany on exam. --s/p IV cal gluconate x11 g --another IV Cal x2g today  Hypomag --monitor and replete PRN  Anxiety - Continue home paroxetine and Xanax as needed  Essential hypertension - Hold home telmisartan-HCTZ in the setting of AKI, started on oral hydralazine instead. --increase hydralazine to 75 mg q8h  --IV hydralazine PRN  OSA (obstructive sleep apnea) - CPAP at bedtime  Chronic back pain --worsened with acute illness, now much better controlled --Norco PRN --cont home gabapentin (monitor for sedation) --Voltaren gel   DVT prophylaxis: Heparin SQ Code Status: Full code  Family Communication:  Level of care: Med-Surg Dispo:   The patient is from: home Anticipated d/c is to: home Anticipated d/c date is: 2-3 days   Subjective and Interval History:  No complaint of back pain anymore.  Pt encouraged to walk around.    Na remains persistently low.   Objective: Vitals:   11/25/22 0300 11/25/22 0425 11/25/22 0718 11/25/22 1625  BP:  (!) 164/95 (!) 144/85 (!) 150/89  Pulse:  98 96 98  Resp:  16 19 20   Temp:  98.4 F (36.9 C) 98.2 F (36.8 C) 99.2 F (37.3 C)  TempSrc:  Oral    SpO2:  96% 95% 92%  Weight:  100.6 kg     Height:        Intake/Output Summary (Last 24 hours) at 11/25/2022 1822 Last data filed at 11/25/2022 1104 Gross per 24 hour  Intake 1918.93 ml  Output 800 ml  Net 1118.93 ml   Filed Weights   11/22/22 0516 11/23/22 0837 11/25/22 0300  Weight: 93.4 kg 98.7 kg 100.6 kg    Examination:   Constitutional: NAD, AAOx3 HEENT: conjunctivae and lids normal, EOMI CV: No cyanosis.    RESP: normal respiratory effort, on RA Neuro: II - XII grossly intact.   Psych: Normal mood and affect.  Appropriate judgement and reason   Data Reviewed: I have personally reviewed labs and imaging studies  Time spent: 35 minutes  Enzo Bi, MD [facility] If 7PM-7AM, please contact night-coverage 11/25/2022, 6:22 PM

## 2022-11-25 NOTE — Progress Notes (Signed)
[facility]  ROUNDING NOTE   Subjective:   Patient seen resting quietly in bed, eyes closed No family at bedside States he feels well today Appetite appropriate, denies nausea and vomiting No lower extremity edema Remains on room air  Sodium 120   Objective:  Vital signs in last 24 hours:  Temp:  [98 F (36.7 C)-98.6 F (37 C)] 98.2 F (36.8 C) (03/17 0718) Pulse Rate:  [96-106] 96 (03/17 0718) Resp:  [16-20] 19 (03/17 0718) BP: (143-164)/(85-103) 144/85 (03/17 0718) SpO2:  [95 %-98 %] 95 % (03/17 0718) Weight:  [100.6 kg] 100.6 kg (03/17 0300)  Weight change: 1.852 kg Filed Weights   11/22/22 0516 11/23/22 0837 11/25/22 0300  Weight: 93.4 kg 98.7 kg 100.6 kg    Intake/Output: I/O last 3 completed shifts: In: 4528.9 [P.O.:600; I.V.:3830.4; IV Piggyback:98.6] Out: 800 [Urine:800]   Intake/Output this shift:  Total I/O In: 240 [P.O.:240] Out: -   Physical Exam: General: NAD  Head: Normocephalic, atraumatic. Moist oral mucosal membranes  Eyes: Anicteric  Lungs:  Left-sided upper expiratory wheeze, normal effort  Heart: Regular rate and rhythm  Abdomen:  Soft, nontender  Extremities: No peripheral edema.  Neurologic: Nonfocal, moving all four extremities  Skin: No lesions  Access: None    Basic Metabolic Panel: Recent Labs  Lab 11/21/22 0500 11/21/22 0502 11/21/22 1337 11/22/22 0722 11/23/22 0543 11/24/22 0511 11/25/22 0432  NA  --    < > 122* 120* 124* 120* 120*  K  --    < > 3.2* 3.3* 3.5 2.9* 3.1*  CL  --    < > 90* 91* 88* 87* 84*  CO2  --    < > 19* 21* 22 23 24   GLUCOSE  --    < > 116* 91 92 95 88  BUN  --    < > 48* 28* 15 10 10   CREATININE  --    < > 3.57* 1.94* 1.27* 1.01 0.85  CALCIUM  --    < > 6.0* 6.2* 6.8* 7.0* 7.4*  MG 2.5*  --   --  2.3 2.1 1.9 1.7  PHOS  --   --   --  2.6  --   --   --    < > = values in this interval not displayed.     Liver Function Tests: Recent Labs  Lab 11/20/22 1133 11/21/22 0500   AST 44* 35  ALT 25 21  ALKPHOS 86 84  BILITOT 1.1 1.1  PROT 6.9 5.9*  ALBUMIN 3.2* 2.5*    Recent Labs  Lab 11/20/22 1133 11/22/22 0722  LIPASE 185* 41    No results for input(s): "AMMONIA" in the last 168 hours.  CBC: Recent Labs  Lab 11/21/22 0500 11/22/22 0722 11/23/22 0543 11/24/22 0511 11/25/22 0432  WBC 12.8* 16.7* 22.7* 23.6* 21.7*  HGB 11.5* 11.5* 11.1* 10.4* 10.7*  HCT 32.4* 32.4* 31.4* 29.7* 30.7*  MCV 94.7 94.7 95.2 94.0 94.2  PLT 129* 149* 194 236 296     Cardiac Enzymes: No results for input(s): "CKTOTAL", "CKMB", "CKMBINDEX", "TROPONINI" in the last 168 hours.  BNP: Invalid input(s): "POCBNP"  CBG: Recent Labs  Lab 11/22/22 1953  [place] 49*     Microbiology: Results for orders placed or performed during the hospital encounter of 11/20/22  Resp panel by RT-PCR (RSV, Flu A&B, Covid) Anterior Nasal Swab     Status: None   Collection Time: 11/20/22 12:01 PM   Specimen: Anterior Nasal Swab  Result Value Ref Range Status   SARS Coronavirus 2 by RT PCR NEGATIVE NEGATIVE Final    Comment: (NOTE) SARS-CoV-2 target nucleic acids are NOT DETECTED.  The SARS-CoV-2 RNA is generally detectable in upper respiratory specimens during the acute phase of infection. The lowest concentration of SARS-CoV-2 viral copies this assay can detect is 138 copies/mL. A negative result does not preclude SARS-Cov-2 infection and should not be used as the sole basis for treatment or other patient management decisions. A negative result may occur with  improper specimen collection/handling, submission of specimen other than nasopharyngeal swab, presence of viral mutation(s) within the areas targeted by this assay, and inadequate number of viral copies(<138 copies/mL). A negative result must be combined with clinical observations, patient history, and epidemiological information. The expected result is Negative.  Fact Sheet for Patients:   EntrepreneurPulse.com.au  Fact Sheet for Healthcare Providers:  IncredibleEmployment.be  This test is no t yet approved or cleared by the [place] FDA and  has been authorized for detection and/or diagnosis of SARS-CoV-2 by FDA under an Emergency Use Authorization (EUA). This EUA will remain  in effect (meaning this test can be used) for the duration of the COVID-19 declaration under Section 564(b)(1) of the Act, 21 U.S.C.section 360bbb-3(b)(1), unless the authorization is terminated  or revoked sooner.       Influenza A by PCR NEGATIVE NEGATIVE Final   Influenza B by PCR NEGATIVE NEGATIVE Final    Comment: (NOTE) The Xpert Xpress SARS-CoV-2/FLU/RSV plus assay is intended as an aid in the diagnosis of influenza from Nasopharyngeal swab specimens and should not be used as a sole basis for treatment. Nasal washings and aspirates are unacceptable for Xpert Xpress SARS-CoV-2/FLU/RSV testing.  Fact Sheet for Patients: EntrepreneurPulse.com.au  Fact Sheet for Healthcare Providers: IncredibleEmployment.be  This test is not yet approved or cleared by the [place] FDA and has been authorized for detection and/or diagnosis of SARS-CoV-2 by FDA under an Emergency Use Authorization (EUA). This EUA will remain in effect (meaning this test can be used) for the duration of the COVID-19 declaration under Section 564(b)(1) of the Act, 21 U.S.C. section 360bbb-3(b)(1), unless the authorization is terminated or revoked.     Resp Syncytial Virus by PCR NEGATIVE NEGATIVE Final    Comment: (NOTE) Fact Sheet for Patients: EntrepreneurPulse.com.au  Fact Sheet for Healthcare Providers: IncredibleEmployment.be  This test is not yet approved or cleared by the [place] FDA and has been authorized for detection and/or diagnosis of SARS-CoV-2 by FDA under an Emergency Use  Authorization (EUA). This EUA will remain in effect (meaning this test can be used) for the duration of the COVID-19 declaration under Section 564(b)(1) of the Act, 21 U.S.C. section 360bbb-3(b)(1), unless the authorization is terminated or revoked.  Performed at [facility], [address] [place], [place] [address]     Coagulation Studies: No results for input(s): "LABPROT", "INR" in the last 72 hours.  Urinalysis: No results for input(s): "COLORURINE", "LABSPEC", "PHURINE", "GLUCOSEU", "HGBUR", "BILIRUBINUR", "KETONESUR", "PROTEINUR", "UROBILINOGEN", "NITRITE", "LEUKOCYTESUR" in the last 72 hours.  Invalid input(s): "APPERANCEUR"     Imaging: CT ABDOMEN PELVIS W CONTRAST  Result Date: 11/23/2022 CLINICAL DATA:  Pancreatitis with worsening leukocytosis. EXAM: CT ABDOMEN AND PELVIS WITH CONTRAST TECHNIQUE: Multidetector CT imaging of the abdomen and pelvis was performed using the standard protocol following bolus administration of intravenous contrast. RADIATION DOSE REDUCTION: This exam was performed according to the departmental dose-optimization program which includes automated exposure control, adjustment of the mA  and/or kV according to patient size and/or use of iterative reconstruction technique. CONTRAST:  166mL OMNIPAQUE IOHEXOL 300 MG/ML  SOLN COMPARISON:  CT abdomen pelvis dated 11/20/2022. FINDINGS: Lower chest: There is a moderate left pleural effusion with associated atelectasis. Hepatobiliary: The liver is diffusely hypoattenuating, consistent with hepatic steatosis no focal liver abnormality is seen. No gallstones, gallbladder wall thickening, or biliary dilatation. Pancreas: Significant peripancreatic inflammatory changes appear mildly increased compared to 11/20/2022. There is an approximately 3 cm area of apparent non enhancement involving the pancreatic body (series 2, images 20 9-30 and series 6 images 47-49). These findings are consistent with acute  necrotizing pancreatitis. The prior exam was a noncontrast exam and therefore it is unclear if there was an area of non enhancement on 11/20/2022. No definite evidence of acute necrotic collections. No soft tissue gas seen in the pancreatic bed suggest infected necrosis. Spleen: Normal in size without focal abnormality. Adrenals/Urinary Tract: Adrenal glands are unremarkable. Kidneys are normal, without renal calculi, focal lesion, or hydronephrosis. Bladder is unremarkable. Stomach/Bowel: Stomach is within normal limits. Enteric contrast reaches the distal ileum. The appendix is not definitely identified, however no pericecal inflammatory changes are noted to suggest acute appendicitis. There is colonic diverticulosis without evidence of diverticulitis. No evidence of bowel wall thickening, distention, or inflammatory changes. Vascular/Lymphatic: No significant vascular findings are present. No evidence of splenic vein thrombosis or splenic artery pseudoaneurysm. No enlarged abdominal or pelvic lymph nodes. Reproductive: Prostate is unremarkable. Other: No abdominal wall hernia or abnormality. No abdominopelvic ascites. Musculoskeletal: Degenerative changes are seen in the spine. IMPRESSION: 1. Acute necrotizing pancreatitis with an approximately 3 cm area of non enhancement involving the pancreatic body. The degree of peripancreatic inflammatory changes is slightly increased since 11/20/2022. No definite acute necrotic collections or findings to suggest infected necrosis. 2. Moderate left pleural effusion with associated atelectasis. 3. Hepatic steatosis. Electronically Signed   By: Zerita Boers M.D.   On: 11/23/2022 13:15     Medications:    calcium gluconate     lactated ringers 100 mL/hr at 11/25/22 0849    calcium carbonate  1 tablet Oral TID WC   diclofenac Sodium  4 g Topical QID   gabapentin  300 mg Oral TID   heparin  5,000 Units Subcutaneous Q8H   hydrALAZINE  50 mg Oral Q8H   PARoxetine  30  mg Oral Daily   polyethylene glycol  34 g Oral BID   potassium chloride  40 mEq Oral Once   sodium chloride flush  3 mL Intravenous Q12H   sodium chloride  1 g Oral TID WC   acetaminophen **OR** acetaminophen, ALPRAZolam, hydrALAZINE, HYDROcodone-acetaminophen, ondansetron **OR** ondansetron (ZOFRAN) IV  Assessment/ Plan:  Mr. Arthur Patterson is a 62 y.o.  male with past medical continues including hyperlipidemia, GERD, hypertension, lumbar disc disease, and diverticulosis, who was admitted to [facility] on 11/20/2022 for Hypocalcemia [E83.51] Nausea [R11.0] Acute renal failure ([place]) [N17.9] AKI (acute kidney injury) ([place]) [N17.9]    Acute kidney injury likely secondary to severe illness and hypovolemia.  Normal renal function noted on previous labs.  No IV contrast exposure.  Creatinine on ED arrival 7.82 with GFR 7.  Renal ultrasound negative for obstruction.   Renal function is now at baseline for this patient. Patient encouraged to maintain oral intake.  Lab Results  Component Value Date   CREATININE 0.85 11/25/2022   CREATININE 1.01 11/24/2022   CREATININE 1.27 (H) 11/23/2022    Intake/Output Summary (Last 24 hours) at  11/25/2022 1116 Last data filed at 11/25/2022 1104 Gross per 24 hour  Intake 4408.92 ml  Output 800 ml  Net 3608.92 ml    2.  Severe hyponatremia, sodium on admission 113. Continue LR at 167ml/hr. sodium remains 120.  Started on salt tabs 1 g 3 times daily on 11/24/2022.  Will consider 3% hypertonic saline if no improvement tomorrow.  3.  Hypertension, essential.  Home regimen currently telmisartan-hydrochlorothiazide.  Currently held.  Blood pressure stable    LOS: 5   3/17/202411:16 AM

## 2022-11-26 DIAGNOSIS — N179 Acute kidney failure, unspecified: Secondary | ICD-10-CM | POA: Diagnosis not present

## 2022-11-26 LAB — MRSA NEXT GEN BY PCR, NASAL: MRSA by PCR Next Gen: NOT DETECTED

## 2022-11-26 LAB — CBC
HCT: 29.6 % — ABNORMAL LOW (ref 39.0–52.0)
Hemoglobin: 10.5 g/dL — ABNORMAL LOW (ref 13.0–17.0)
MCH: 33.2 pg (ref 26.0–34.0)
MCHC: 35.5 g/dL (ref 30.0–36.0)
MCV: 93.7 fL (ref 80.0–100.0)
Platelets: 299 10*3/uL (ref 150–400)
RBC: 3.16 MIL/uL — ABNORMAL LOW (ref 4.22–5.81)
RDW: 13.9 % (ref 11.5–15.5)
WBC: 21.2 10*3/uL — ABNORMAL HIGH (ref 4.0–10.5)
nRBC: 0 % (ref 0.0–0.2)

## 2022-11-26 LAB — BASIC METABOLIC PANEL
Anion gap: 9 (ref 5–15)
BUN: 12 mg/dL (ref 8–23)
CO2: 22 mmol/L (ref 22–32)
Calcium: 7.2 mg/dL — ABNORMAL LOW (ref 8.9–10.3)
Chloride: 88 mmol/L — ABNORMAL LOW (ref 98–111)
Creatinine, Ser: 0.8 mg/dL (ref 0.61–1.24)
GFR, Estimated: >60 mL/min (ref >60)
Glucose, Bld: 90 mg/dL (ref 70–99)
Potassium: 3 mmol/L — ABNORMAL LOW (ref 3.5–5.1)
Sodium: 119 mmol/L — CL (ref 135–145)

## 2022-11-26 LAB — OSMOLALITY, URINE: Osmolality, Ur: 526 mOsm/kg (ref 300–900)

## 2022-11-26 LAB — SODIUM
Sodium: 121 mmol/L — ABNORMAL LOW (ref 135–145)
Sodium: 121 mmol/L — ABNORMAL LOW (ref 135–145)
Sodium: 120 mmol/L — ABNORMAL LOW (ref 135–145)

## 2022-11-26 LAB — OSMOLALITY
Osmolality: 249 mOsm/kg — CL (ref 275–295)
Osmolality: 254 mOsm/kg — ABNORMAL LOW (ref 275–295)

## 2022-11-26 LAB — MAGNESIUM: Magnesium: 1.6 mg/dL — ABNORMAL LOW (ref 1.7–2.4)

## 2022-11-26 LAB — SODIUM, URINE, RANDOM: Sodium, Ur: 43 mmol/L

## 2022-11-26 MED ORDER — CHLORHEXIDINE GLUCONATE CLOTH 2 % EX PADS
6.0000 | MEDICATED_PAD | Freq: Every day | CUTANEOUS | Status: DC
  Administered 2022-11-26 – 2022-12-02 (×7): 6 via TOPICAL

## 2022-11-26 MED ORDER — FLUTICASONE PROPIONATE 50 MCG/ACT NA SUSP
2.0000 | Freq: Every day | NASAL | Status: DC | PRN
  Administered 2022-11-26 – 2022-11-29 (×3): 2 via NASAL
  Filled 2022-11-26: qty 16

## 2022-11-26 MED ORDER — POTASSIUM CHLORIDE CRYS ER 20 MEQ PO TBCR
40.0000 meq | EXTENDED_RELEASE_TABLET | ORAL | Status: AC
  Administered 2022-11-26 (×2): 40 meq via ORAL
  Filled 2022-11-26 (×2): qty 2

## 2022-11-26 MED ORDER — CALCIUM GLUCONATE-NACL 2-0.675 GM/100ML-% IV SOLN
2.0000 g | Freq: Once | INTRAVENOUS | Status: AC
  Administered 2022-11-26: 2000 mg via INTRAVENOUS
  Filled 2022-11-26: qty 100

## 2022-11-26 MED ORDER — HYDRALAZINE HCL 50 MG PO TABS
100.0000 mg | ORAL_TABLET | Freq: Three times a day (TID) | ORAL | Status: DC
  Administered 2022-11-26 – 2022-12-02 (×18): 100 mg via ORAL
  Filled 2022-11-26 (×19): qty 2

## 2022-11-26 MED ORDER — SODIUM CHLORIDE 3 % IV SOLN
INTRAVENOUS | Status: DC
  Filled 2022-11-26 (×8): qty 500

## 2022-11-26 MED ORDER — MAGNESIUM SULFATE 4 GM/100ML IV SOLN
4.0000 g | Freq: Once | INTRAVENOUS | Status: AC
  Administered 2022-11-26: 4 g via INTRAVENOUS
  Filled 2022-11-26: qty 100

## 2022-11-26 NOTE — Progress Notes (Signed)
PROGRESS NOTE    Arthur Patterson  [id number] DOB: August 23, 1961 DOA: 11/20/2022 PCP: Rusty Aus, MD  IC12A/IC12A-AA  LOS: 6 days   Brief hospital course:   Assessment & Plan: Arthur Patterson is a 62 y.o. male with medical history significant of hypertension, hyperlipidemia, lumbar disc disease, GERD, diverticulosis who presents to the ED due to abdominal pain.   Arthur Patterson states that on 11/16/2022, he began to experience generalized malaise and nausea.  The next day, he began to have multiple episodes of nonbilious, nonbloody vomit.  He states that he has had at least 5+ episodes of vomiting per day since then.  In addition, he has had diarrhea intermittently with the most recent episode yesterday.  Arthur Patterson states that he was taking meloxicam nearly every day for several months but his last dose was on 3/6. He has been still taking his home antihypertensives. He denies any daily alcohol use.    * Acute renal failure [facility]) Patient presenting with creatinine of 7.8, previously within normal limits.  Most likely secondary to significant hypovolemia in the setting of acute pancreatitis, and N/V/D.  Last BMP obtained 1 week ago prior to symptom onset with creatinine of 1.06.  - Nephrology consulted; appreciate their recommendations --Cr improving with IVF, now back to baseline 0.85  Acute pancreatitis with developing necrosis Patient presenting with nausea and vomiting for 3 to 4 days in the setting of elevated lipase and evidence of markedly inflamed pancreas on CT imaging.  Etiology uncertain at this time as patient declines any recent alcohol use and no gallstones or gallbladder thickening seen on CT imaging and no biliary dilation.  Triglycerides 199 on presentation.  Only medication he takes that is known to cause pancreatitis is simvastatin. --s/p aggressive IVF. --since no signs of infection currently, no need for ppx abx, agreed with curbside GI. --If fever develops, then obtain  blood cx and start meropenem. --repeat CT scan in 4 weeks  Hyponatremia 2/2 hypovolemia Na 113 on presentation.  In the setting of profound dehydration and acute pancreatitis.  Despite renal recovery and continuous IVF for the since presentation, sodium remains decreased to 119  Plan: --start 3% hypertonic saline@30 , per nephro  Hypocalcemia Likely secondary to pancreatitis.  No EKG changes or tetany on exam. --s/p IV cal gluconate x13 g --another IV Cal 2g today  Hypomag --monitor and replete PRN with IV mag  Anxiety - Continue home paroxetine and Xanax as needed  Essential hypertension - Hold home telmisartan-HCTZ in the setting of AKI, started on oral hydralazine instead. --increase hydralazine to 100 mg q8h  --IV hydralazine PRN  OSA (obstructive sleep apnea) - CPAP at bedtime  Chronic back pain --worsened with acute illness, now much better controlled --Norco PRN --cont home gabapentin (monitor for sedation) --Voltaren gel   DVT prophylaxis: Heparin SQ Code Status: Full code  Family Communication:  Level of care: Stepdown Dispo:   The patient is from: home Anticipated d/c is to: home Anticipated d/c date is: undetermined   Subjective and Interval History:  No new complaint today.  Due to persistent hyponatremia, nephro started pt on 3% saline.   Objective: Vitals:   11/26/22 1500 11/26/22 1600 11/26/22 1700 11/26/22 1800  BP: (!) 140/95 (!) 145/91 (!) 142/93 (!) 161/92  Pulse: 97 95 (!) 108 (!) 103  Resp: 19 20 (!) 21 19  Temp:      TempSrc:      SpO2: 92% 95% 92% 91%  Weight:  Height:        Intake/Output Summary (Last 24 hours) at 11/26/2022 1856 Last data filed at 11/26/2022 1543 Gross per 24 hour  Intake 2725.79 ml  Output 500 ml  Net 2225.79 ml   Filed Weights   11/25/22 0300 11/26/22 0500 11/26/22 1405  Weight: 100.6 kg 103.5 kg 103 kg    Examination:   Constitutional: NAD, AAOx3 HEENT: conjunctivae and lids normal, EOMI CV: No  cyanosis.   RESP: normal respiratory effort, on RA Neuro: II - XII grossly intact.   Psych: Normal mood and affect.  Appropriate judgement and reason   Data Reviewed: I have personally reviewed labs and imaging studies  Time spent: 35 minutes  Enzo Bi, MD [facility] If 7PM-7AM, please contact night-coverage 11/26/2022, 6:56 PM

## 2022-11-26 NOTE — Progress Notes (Signed)
[facility]  ROUNDING NOTE   Subjective:   Patient seen resting in bed, no family at bedside Alert and oriented, slightly drowsy Tolerating meals without nausea or vomiting Remains on room air  Sodium 119   Objective:  Vital signs in last 24 hours:  Temp:  [98 F (36.7 C)-99.5 F (37.5 C)] 98 F (36.7 C) (03/18 0744) Pulse Rate:  [93-106] 93 (03/18 0744) Resp:  [16-20] 20 (03/18 0744) BP: (135-154)/(86-98) 146/98 (03/18 0744) SpO2:  [92 %-100 %] 100 % (03/18 0744) Weight:  [103.5 kg] 103.5 kg (03/18 0500)  Weight change: 2.9 kg Filed Weights   11/23/22 0837 11/25/22 0300 11/26/22 0500  Weight: 98.7 kg 100.6 kg 103.5 kg    Intake/Output: I/O last 3 completed shifts: In: 1918.9 [P.O.:360; I.V.:1558.9] Out: 800 [Urine:800]   Intake/Output this shift:  No intake/output data recorded.  Physical Exam: General: NAD  Head: Normocephalic, atraumatic. Moist oral mucosal membranes  Eyes: Anicteric  Lungs:  expiratory wheeze, normal effort  Heart: Regular rate and rhythm  Abdomen:  Soft, nontender  Extremities: No peripheral edema.  Neurologic: Nonfocal, moving all four extremities  Skin: No lesions  Access: None    Basic Metabolic Panel: Recent Labs  Lab 11/22/22 0722 11/23/22 0543 11/24/22 0511 11/25/22 0432 11/26/22 0505 11/26/22 1032  NA 120* 124* 120* 120* 119* 121*  K 3.3* 3.5 2.9* 3.1* 3.0*  --   CL 91* 88* 87* 84* 88*  --   CO2 21* 22 23 24 22   --   GLUCOSE 91 92 95 88 90  --   BUN 28* 15 10 10 12   --   CREATININE 1.94* 1.27* 1.01 0.85 0.80  --   CALCIUM 6.2* 6.8* 7.0* 7.4* 7.2*  --   MG 2.3 2.1 1.9 1.7 1.6*  --   PHOS 2.6  --   --   --   --   --      Liver Function Tests: Recent Labs  Lab 11/20/22 1133 11/21/22 0500  AST 44* 35  ALT 25 21  ALKPHOS 86 84  BILITOT 1.1 1.1  PROT 6.9 5.9*  ALBUMIN 3.2* 2.5*    Recent Labs  Lab 11/20/22 1133 11/22/22 0722  LIPASE 185* 41    No results for input(s): "AMMONIA" in the  last 168 hours.  CBC: Recent Labs  Lab 11/22/22 0722 11/23/22 0543 11/24/22 0511 11/25/22 0432 11/26/22 0505  WBC 16.7* 22.7* 23.6* 21.7* 21.2*  HGB 11.5* 11.1* 10.4* 10.7* 10.5*  HCT 32.4* 31.4* 29.7* 30.7* 29.6*  MCV 94.7 95.2 94.0 94.2 93.7  PLT 149* 194 236 296 299     Cardiac Enzymes: No results for input(s): "CKTOTAL", "CKMB", "CKMBINDEX", "TROPONINI" in the last 168 hours.  BNP: Invalid input(s): "POCBNP"  CBG: Recent Labs  Lab 11/22/22 1953  [place] 94*     Microbiology: Results for orders placed or performed during the hospital encounter of 11/20/22  Resp panel by RT-PCR (RSV, Flu A&B, Covid) Anterior Nasal Swab     Status: None   Collection Time: 11/20/22 12:01 PM   Specimen: Anterior Nasal Swab  Result Value Ref Range Status   SARS Coronavirus 2 by RT PCR NEGATIVE NEGATIVE Final    Comment: (NOTE) SARS-CoV-2 target nucleic acids are NOT DETECTED.  The SARS-CoV-2 RNA is generally detectable in upper respiratory specimens during the acute phase of infection. The lowest concentration of SARS-CoV-2 viral copies this assay can detect is 138 copies/mL. A negative result does not preclude SARS-Cov-2 infection and  should not be used as the sole basis for treatment or other patient management decisions. A negative result may occur with  improper specimen collection/handling, submission of specimen other than nasopharyngeal swab, presence of viral mutation(s) within the areas targeted by this assay, and inadequate number of viral copies(<138 copies/mL). A negative result must be combined with clinical observations, patient history, and epidemiological information. The expected result is Negative.  Fact Sheet for Patients:  EntrepreneurPulse.com.au  Fact Sheet for Healthcare Providers:  IncredibleEmployment.be  This test is no t yet approved or cleared by the [place] FDA and  has been authorized for detection  and/or diagnosis of SARS-CoV-2 by FDA under an Emergency Use Authorization (EUA). This EUA will remain  in effect (meaning this test can be used) for the duration of the COVID-19 declaration under Section 564(b)(1) of the Act, 21 U.S.C.section 360bbb-3(b)(1), unless the authorization is terminated  or revoked sooner.       Influenza A by PCR NEGATIVE NEGATIVE Final   Influenza B by PCR NEGATIVE NEGATIVE Final    Comment: (NOTE) The Xpert Xpress SARS-CoV-2/FLU/RSV plus assay is intended as an aid in the diagnosis of influenza from Nasopharyngeal swab specimens and should not be used as a sole basis for treatment. Nasal washings and aspirates are unacceptable for Xpert Xpress SARS-CoV-2/FLU/RSV testing.  Fact Sheet for Patients: EntrepreneurPulse.com.au  Fact Sheet for Healthcare Providers: IncredibleEmployment.be  This test is not yet approved or cleared by the [place] FDA and has been authorized for detection and/or diagnosis of SARS-CoV-2 by FDA under an Emergency Use Authorization (EUA). This EUA will remain in effect (meaning this test can be used) for the duration of the COVID-19 declaration under Section 564(b)(1) of the Act, 21 U.S.C. section 360bbb-3(b)(1), unless the authorization is terminated or revoked.     Resp Syncytial Virus by PCR NEGATIVE NEGATIVE Final    Comment: (NOTE) Fact Sheet for Patients: EntrepreneurPulse.com.au  Fact Sheet for Healthcare Providers: IncredibleEmployment.be  This test is not yet approved or cleared by the [place] FDA and has been authorized for detection and/or diagnosis of SARS-CoV-2 by FDA under an Emergency Use Authorization (EUA). This EUA will remain in effect (meaning this test can be used) for the duration of the COVID-19 declaration under Section 564(b)(1) of the Act, 21 U.S.C. section 360bbb-3(b)(1), unless the authorization is terminated  or revoked.  Performed at [facility], [address] [place], [place] [address]     Coagulation Studies: No results for input(s): "LABPROT", "INR" in the last 72 hours.  Urinalysis: No results for input(s): "COLORURINE", "LABSPEC", "PHURINE", "GLUCOSEU", "HGBUR", "BILIRUBINUR", "KETONESUR", "PROTEINUR", "UROBILINOGEN", "NITRITE", "LEUKOCYTESUR" in the last 72 hours.  Invalid input(s): "APPERANCEUR"     Imaging: No results found.   Medications:    magnesium sulfate bolus IVPB     sodium chloride (hypertonic)      calcium carbonate  1 tablet Oral TID WC   diclofenac Sodium  4 g Topical QID   gabapentin  300 mg Oral TID   heparin  5,000 Units Subcutaneous Q8H   hydrALAZINE  75 mg Oral Q8H   PARoxetine  30 mg Oral Daily   polyethylene glycol  34 g Oral BID   potassium chloride  40 mEq Oral Q4H   sodium chloride flush  3 mL Intravenous Q12H   sodium chloride  1 g Oral TID WC   acetaminophen **OR** acetaminophen, ALPRAZolam, hydrALAZINE, HYDROcodone-acetaminophen, ondansetron **OR** ondansetron (ZOFRAN) IV  Assessment/ Plan:  Arthur Patterson is  a 62 y.o.  male with past medical continues including hyperlipidemia, GERD, hypertension, lumbar disc disease, and diverticulosis, who was admitted to [facility] on 11/20/2022 for Hypocalcemia [E83.51] Nausea [R11.0] Acute renal failure ([facility]) [N17.9] AKI (acute kidney injury) ([place]) [N17.9]    Acute kidney injury likely secondary to severe illness and hypovolemia.  Normal renal function noted on previous labs.  No IV contrast exposure.  Creatinine on ED arrival 7.82 with GFR 7.  Renal ultrasound negative for obstruction.   Creatinine remained stable.  Lab Results  Component Value Date   CREATININE 0.80 11/26/2022   CREATININE 0.85 11/25/2022   CREATININE 1.01 11/24/2022   No intake or output data in the 24 hours ending 11/26/22 1229  2.  Severe hyponatremia, sodium on admission 113. Despite renal recovery and  continuous IVF for a few days, sodium remains decreased to 119. Will order 3% hypertonic saline at 30 ml/hr. Will monitor levels frequently.   3.  Hypertension, essential.  Home regimen currently telmisartan-hydrochlorothiazide.  Currently held.  Blood pressure 146/98    LOS: 6   3/18/202412:29 PM

## 2022-11-26 NOTE — Progress Notes (Signed)
Dr Billie Ruddy notified via secure chat Na was 119.

## 2022-11-26 NOTE — Progress Notes (Signed)
An USGPIV (ultrasound guided PIV) has been placed for short-term vasopressor infusion. A correctly placed ivWatch must be used when administering Vasopressors. Should this treatment be needed beyond 72 hours, central line access should be obtained.  It will be the responsibility of the bedside nurse to follow best practice to prevent extravasations.   ?

## 2022-11-26 NOTE — Consult Note (Signed)
MEDICATION RELATED CONSULT NOTE - INITIAL   Pharmacy Consult for Hypertonic saline monitoring Indication: hyponatremia  No Known Allergies  Patient Measurements: Height: 5\' 9"  (175.3 cm) Weight: 103.5 kg (228 lb 2.8 oz) IBW/kg (Calculated) : 70.7   Labs:    Latest Ref Rng & Units 11/26/2022    5:05 AM 11/25/2022    4:32 AM 11/24/2022    5:11 AM  CMP  Glucose 70 - 99 mg/dL 90  88  95   BUN 8 - 23 mg/dL 12  10  10    Creatinine 0.61 - 1.24 mg/dL 0.80  0.85  1.01   Sodium 135 - 145 mmol/L 119  120  120   Potassium 3.5 - 5.1 mmol/L 3.0  3.1  2.9   Chloride 98 - 111 mmol/L 88  84  87   CO2 22 - 32 mmol/L 22  24  23    Calcium 8.9 - 10.3 mg/dL 7.2  7.4  7.0     Estimated Creatinine Clearance: 112 mL/min (by C-G formula based on SCr of 0.8 mg/dL).  Medical History: Past Medical History:  Diagnosis Date   Anxiety    Family history of adverse reaction to anesthesia    FAther had trouble waking   Hypertension    Sleep apnea    Spondylolisthesis at L4-L5 level     Medications:   -Sodium Chloride 3% @ 25 ml/hr -LR @ 100 ml/hr -NaCl 1 gram po TID  Assessment: 62 yo male who presented to ED due to abdominal pain.  PMH includes HTN, HLD, GERD, diverticulosis.  Patient found to have acute pancreatitis and acute kidney injury.  Patient also has multiple electrolyte abnormality including hyponatremia, hypomagnesimia, hypocalcemia, and hypokalemia.  Pharmacy consulted to monitor while on hypertonic saline infusion.  Results: 03/18 @ 0505, Na 119 03/18 @ 1032, Na 121  Goal of Therapy:  -Increase of Sodium by 4-6 mEq/L in 4-6 hours  -Increase of no more than 10-12 mEq/L in 24 hours  Plan:  -Na level every 2 hours x 2, then every 4 hours while on hypertonic saline infusion. -Stop hypertonic infusion and contact provider if increase of greater than 4 mEq/L in 2 hours, greater than 6 mEq/L in 4 hours, or 10 mEq in 24 hours.  Lorin Picket, PharmD 11/26/2022,10:39 AM

## 2022-11-26 NOTE — Consult Note (Signed)
MEDICATION RELATED CONSULT NOTE - INITIAL   Pharmacy Consult for Hypertonic saline monitoring Indication: hyponatremia  No Known Allergies  Patient Measurements: Height: 5\' 9"  (175.3 cm) Weight: 103 kg (227 lb 1.2 oz) IBW/kg (Calculated) : 70.7   Labs:    Latest Ref Rng & Units 11/26/2022   11:00 PM 11/26/2022    5:06 PM 11/26/2022   10:32 AM  CMP  Sodium 135 - 145 mmol/L 120  121  121     Estimated Creatinine Clearance: 111.8 mL/min (by C-G formula based on SCr of 0.8 mg/dL).  Medical History: Past Medical History:  Diagnosis Date   Anxiety    Family history of adverse reaction to anesthesia    FAther had trouble waking   Hypertension    Sleep apnea    Spondylolisthesis at L4-L5 level     Medications:   -Sodium Chloride 3% @ 25 ml/hr -LR @ 100 ml/hr -NaCl 1 gram po TID  Assessment: 62 yo male who presented to ED due to abdominal pain.  PMH includes HTN, HLD, GERD, diverticulosis.  Patient found to have acute pancreatitis and acute kidney injury.  Patient also has multiple electrolyte abnormality including hyponatremia, hypomagnesimia, hypocalcemia, and hypokalemia.  Pharmacy consulted to monitor while on hypertonic saline infusion.  Results: 03/18 @ 0505, Na 119 03/18 @ 1032, Na 121 03/18 2300 Na 120  Goal of Therapy:  -Increase of Sodium by 4-6 mEq/L in 4-6 hours  -Increase of no more than 10-12 mEq/L in 24 hours  Plan:  -Na level every 2 hours x 2, then every 4 hours while on hypertonic saline infusion. -Stop hypertonic infusion and contact provider if increase of greater than 4 mEq/L in 2 hours, greater than 6 mEq/L in 4 hours, or 10 mEq in 24 hours.  Renda Rolls, PharmD, [facility] 11/26/2022 11:32 PM

## 2022-11-27 DIAGNOSIS — N179 Acute kidney failure, unspecified: Secondary | ICD-10-CM | POA: Diagnosis not present

## 2022-11-27 LAB — CBC
HCT: 30.8 % — ABNORMAL LOW (ref 39.0–52.0)
Hemoglobin: 10.7 g/dL — ABNORMAL LOW (ref 13.0–17.0)
MCH: 33 pg (ref 26.0–34.0)
MCHC: 34.7 g/dL (ref 30.0–36.0)
MCV: 95.1 fL (ref 80.0–100.0)
Platelets: 340 10*3/uL (ref 150–400)
RBC: 3.24 MIL/uL — ABNORMAL LOW (ref 4.22–5.81)
RDW: 14.4 % (ref 11.5–15.5)
WBC: 21 10*3/uL — ABNORMAL HIGH (ref 4.0–10.5)
nRBC: 0 % (ref 0.0–0.2)

## 2022-11-27 LAB — SODIUM
Sodium: 121 mmol/L — ABNORMAL LOW (ref 135–145)
Sodium: 124 mmol/L — ABNORMAL LOW (ref 135–145)
Sodium: 125 mmol/L — ABNORMAL LOW (ref 135–145)
Sodium: 125 mmol/L — ABNORMAL LOW (ref 135–145)
Sodium: 126 mmol/L — ABNORMAL LOW (ref 135–145)
Sodium: 125 mmol/L — ABNORMAL LOW (ref 135–145)

## 2022-11-27 LAB — BASIC METABOLIC PANEL
Anion gap: 7 (ref 5–15)
BUN: 14 mg/dL (ref 8–23)
CO2: 22 mmol/L (ref 22–32)
Calcium: 7.2 mg/dL — ABNORMAL LOW (ref 8.9–10.3)
Chloride: 91 mmol/L — ABNORMAL LOW (ref 98–111)
Creatinine, Ser: 0.73 mg/dL (ref 0.61–1.24)
GFR, Estimated: >60 mL/min (ref >60)
Glucose, Bld: 105 mg/dL — ABNORMAL HIGH (ref 70–99)
Potassium: 3.4 mmol/L — ABNORMAL LOW (ref 3.5–5.1)
Sodium: 120 mmol/L — ABNORMAL LOW (ref 135–145)

## 2022-11-27 LAB — MAGNESIUM: Magnesium: 1.9 mg/dL (ref 1.7–2.4)

## 2022-11-27 MED ORDER — POTASSIUM CHLORIDE CRYS ER 20 MEQ PO TBCR
20.0000 meq | EXTENDED_RELEASE_TABLET | Freq: Once | ORAL | Status: AC
  Administered 2022-11-27: 20 meq via ORAL
  Filled 2022-11-27: qty 1

## 2022-11-27 MED ORDER — ENOXAPARIN SODIUM 60 MG/0.6ML IJ SOSY
0.5000 mg/kg | PREFILLED_SYRINGE | INTRAMUSCULAR | Status: DC
  Administered 2022-11-27 – 2022-12-01 (×5): 52.5 mg via SUBCUTANEOUS
  Filled 2022-11-27 (×5): qty 0.6

## 2022-11-27 NOTE — Consult Note (Signed)
MEDICATION RELATED CONSULT NOTE - INITIAL   Pharmacy Consult for Hypertonic saline monitoring Indication: hyponatremia  No Known Allergies  Patient Measurements: Height: 5\' 9"  (175.3 cm) Weight: 104.1 kg (229 lb 8 oz) IBW/kg (Calculated) : 70.7   Labs:    Latest Ref Rng & Units 11/27/2022   11:09 AM 11/27/2022    7:14 AM 11/27/2022    5:07 AM  CMP  Glucose 70 - 99 mg/dL   105   BUN 8 - 23 mg/dL   14   Creatinine 0.61 - 1.24 mg/dL   0.73   Sodium 135 - 145 mmol/L 125  124  120   Potassium 3.5 - 5.1 mmol/L   3.4   Chloride 98 - 111 mmol/L   91   CO2 22 - 32 mmol/L   22   Calcium 8.9 - 10.3 mg/dL   7.2     Estimated Creatinine Clearance: 112.4 mL/min (by C-G formula based on SCr of 0.73 mg/dL).  Medical History: Past Medical History:  Diagnosis Date   Anxiety    Family history of adverse reaction to anesthesia    FAther had trouble waking   Hypertension    Sleep apnea    Spondylolisthesis at L4-L5 level     Medications:   -Sodium Chloride 3% @ 35 ml/hr -NaCl 1 gram po TID  Assessment: 62 yo male who presented to ED due to abdominal pain.  PMH includes HTN, HLD, GERD, diverticulosis.  Patient found to have acute pancreatitis and acute kidney injury.  Patient also has multiple electrolyte abnormality including hyponatremia, hypomagnesimia, hypocalcemia, and hypokalemia.  Pharmacy consulted to monitor while on hypertonic saline infusion.  Results: 03/18 @ 0505, Na 119 03/18 @ 1032, Na 121 03/18 2300 Na 120 03/19 0317 Na 121 03/19 0751 Na 124 03/19 1146 Na 125  Goal of Therapy:  -Increase of Sodium by 4-6 mEq/L in 4-6 hours  -Increase of no more than 10-12 mEq/L in 24 hours  Plan: -Infusion rate maintained at 35 mL/hr per nephrology -Continue rechecking Na q4hrs while on hypertonic saline infusion. -Stop hypertonic infusion and contact provider if increase of greater than 4 mEq/L in 2 hours, greater than 6 mEq/L in 4 hours, or 10 mEq in 24 hours.  Will M.  Ouida Sills, PharmD PGY-1 Pharmacy Resident 11/27/2022 2:09 PM

## 2022-11-27 NOTE — Consult Note (Signed)
MEDICATION RELATED CONSULT NOTE - INITIAL   Pharmacy Consult for Hypertonic saline monitoring Indication: hyponatremia  No Known Allergies  Patient Measurements: Height: 5\' 9"  (175.3 cm) Weight: 103 kg (227 lb 1.2 oz) IBW/kg (Calculated) : 70.7   Labs:    Latest Ref Rng & Units 11/27/2022    3:17 AM 11/26/2022   11:00 PM 11/26/2022    5:06 PM  CMP  Sodium 135 - 145 mmol/L 121  120  121     Estimated Creatinine Clearance: 111.8 mL/min (by C-G formula based on SCr of 0.8 mg/dL).  Medical History: Past Medical History:  Diagnosis Date   Anxiety    Family history of adverse reaction to anesthesia    FAther had trouble waking   Hypertension    Sleep apnea    Spondylolisthesis at L4-L5 level     Medications:   -Sodium Chloride 3% @ 35 ml/hr -LR @ 100 ml/hr -NaCl 1 gram po TID  Assessment: 62 yo male who presented to ED due to abdominal pain.  PMH includes HTN, HLD, GERD, diverticulosis.  Patient found to have acute pancreatitis and acute kidney injury.  Patient also has multiple electrolyte abnormality including hyponatremia, hypomagnesimia, hypocalcemia, and hypokalemia.  Pharmacy consulted to monitor while on hypertonic saline infusion.  Results: 03/18 @ 0505, Na 119 03/18 @ 1032, Na 121 03/18 2300 Na 120 03/19 0317 Na 121  Goal of Therapy:  -Increase of Sodium by 4-6 mEq/L in 4-6 hours  -Increase of no more than 10-12 mEq/L in 24 hours  Plan: -Infusion rate increased to 35 ml/hr -Continue rechecking Na q4hrs while on hypertonic saline infusion. -Stop hypertonic infusion and contact provider if increase of greater than 4 mEq/L in 2 hours, greater than 6 mEq/L in 4 hours, or 10 mEq in 24 hours.  Renda Rolls, PharmD, [facility] 11/27/2022 4:05 AM

## 2022-11-27 NOTE — Progress Notes (Signed)
[facility]  ROUNDING NOTE   Subjective:   Patient seen resting quietly in ICU States he feels improved today, denies weakness and fatigue Appetite remains appropriate  Sodium 124 (120)   Objective:  Vital signs in last 24 hours:  Temp:  [99 F (37.2 C)-99.4 F (37.4 C)] 99 F (37.2 C) (03/19 0300) Pulse Rate:  [95-117] 99 (03/19 0500) Resp:  [17-24] 22 (03/19 0500) BP: (120-161)/(73-100) 154/100 (03/19 0500) SpO2:  [91 %-98 %] 93 % (03/19 0500) Weight:  [103 kg-104.1 kg] 104.1 kg (03/19 0500)  Weight change: -0.5 kg Filed Weights   11/26/22 0500 11/26/22 1405 11/27/22 0500  Weight: 103.5 kg 103 kg 104.1 kg    Intake/Output: I/O last 3 completed shifts: In: 3064.1 [I.V.:2864.1; IV Piggyback:200] Out: 700 [Urine:700]   Intake/Output this shift:  No intake/output data recorded.  Physical Exam: General: NAD  Head: Normocephalic, atraumatic. Moist oral mucosal membranes  Eyes: Anicteric  Lungs:  expiratory wheeze, normal effort  Heart: Regular rate and rhythm  Abdomen:  Soft, nontender  Extremities: No peripheral edema.  Neurologic: Nonfocal, moving all four extremities  Skin: No lesions  Access: None    Basic Metabolic Panel: Recent Labs  Lab 11/22/22 0722 11/23/22 0543 11/24/22 0511 11/25/22 0432 11/26/22 0505 11/26/22 1032 11/26/22 1706 11/26/22 2300 11/27/22 0317 11/27/22 0507 11/27/22 0714  NA 120* 124* 120* 120* 119*   < > 121* 120* 121* 120* 124*  K 3.3* 3.5 2.9* 3.1* 3.0*  --   --   --   --  3.4*  --   CL 91* 88* 87* 84* 88*  --   --   --   --  91*  --   CO2 21* 22 23 24 22   --   --   --   --  22  --   GLUCOSE 91 92 95 88 90  --   --   --   --  105*  --   BUN 28* 15 10 10 12   --   --   --   --  14  --   CREATININE 1.94* 1.27* 1.01 0.85 0.80  --   --   --   --  0.73  --   CALCIUM 6.2* 6.8* 7.0* 7.4* 7.2*  --   --   --   --  7.2*  --   MG 2.3 2.1 1.9 1.7 1.6*  --   --   --   --  1.9  --   PHOS 2.6  --   --   --   --   --   --    --   --   --   --    < > = values in this interval not displayed.     Liver Function Tests: Recent Labs  Lab 11/20/22 1133 11/21/22 0500  AST 44* 35  ALT 25 21  ALKPHOS 86 84  BILITOT 1.1 1.1  PROT 6.9 5.9*  ALBUMIN 3.2* 2.5*    Recent Labs  Lab 11/20/22 1133 11/22/22 0722  LIPASE 185* 41    No results for input(s): "AMMONIA" in the last 168 hours.  CBC: Recent Labs  Lab 11/23/22 0543 11/24/22 0511 11/25/22 0432 11/26/22 0505 11/27/22 0507  WBC 22.7* 23.6* 21.7* 21.2* 21.0*  HGB 11.1* 10.4* 10.7* 10.5* 10.7*  HCT 31.4* 29.7* 30.7* 29.6* 30.8*  MCV 95.2 94.0 94.2 93.7 95.1  PLT 194 236 296 299 340     Cardiac Enzymes: No  results for input(s): "CKTOTAL", "CKMB", "CKMBINDEX", "TROPONINI" in the last 168 hours.  BNP: Invalid input(s): "POCBNP"  CBG: Recent Labs  Lab 11/22/22 1953  [place] 68*     Microbiology: Results for orders placed or performed during the hospital encounter of 11/20/22  Resp panel by RT-PCR (RSV, Flu A&B, Covid) Anterior Nasal Swab     Status: None   Collection Time: 11/20/22 12:01 PM   Specimen: Anterior Nasal Swab  Result Value Ref Range Status   SARS Coronavirus 2 by RT PCR NEGATIVE NEGATIVE Final    Comment: (NOTE) SARS-CoV-2 target nucleic acids are NOT DETECTED.  The SARS-CoV-2 RNA is generally detectable in upper respiratory specimens during the acute phase of infection. The lowest concentration of SARS-CoV-2 viral copies this assay can detect is 138 copies/mL. A negative result does not preclude SARS-Cov-2 infection and should not be used as the sole basis for treatment or other patient management decisions. A negative result may occur with  improper specimen collection/handling, submission of specimen other than nasopharyngeal swab, presence of viral mutation(s) within the areas targeted by this assay, and inadequate number of viral copies(<138 copies/mL). A negative result must be combined with clinical  observations, patient history, and epidemiological information. The expected result is Negative.  Fact Sheet for Patients:  EntrepreneurPulse.com.au  Fact Sheet for Healthcare Providers:  IncredibleEmployment.be  This test is no t yet approved or cleared by the [place] FDA and  has been authorized for detection and/or diagnosis of SARS-CoV-2 by FDA under an Emergency Use Authorization (EUA). This EUA will remain  in effect (meaning this test can be used) for the duration of the COVID-19 declaration under Section 564(b)(1) of the Act, 21 U.S.C.section 360bbb-3(b)(1), unless the authorization is terminated  or revoked sooner.       Influenza A by PCR NEGATIVE NEGATIVE Final   Influenza B by PCR NEGATIVE NEGATIVE Final    Comment: (NOTE) The Xpert Xpress SARS-CoV-2/FLU/RSV plus assay is intended as an aid in the diagnosis of influenza from Nasopharyngeal swab specimens and should not be used as a sole basis for treatment. Nasal washings and aspirates are unacceptable for Xpert Xpress SARS-CoV-2/FLU/RSV testing.  Fact Sheet for Patients: EntrepreneurPulse.com.au  Fact Sheet for Healthcare Providers: IncredibleEmployment.be  This test is not yet approved or cleared by the [place] FDA and has been authorized for detection and/or diagnosis of SARS-CoV-2 by FDA under an Emergency Use Authorization (EUA). This EUA will remain in effect (meaning this test can be used) for the duration of the COVID-19 declaration under Section 564(b)(1) of the Act, 21 U.S.C. section 360bbb-3(b)(1), unless the authorization is terminated or revoked.     Resp Syncytial Virus by PCR NEGATIVE NEGATIVE Final    Comment: (NOTE) Fact Sheet for Patients: EntrepreneurPulse.com.au  Fact Sheet for Healthcare Providers: IncredibleEmployment.be  This test is not yet approved or cleared by  the [place] FDA and has been authorized for detection and/or diagnosis of SARS-CoV-2 by FDA under an Emergency Use Authorization (EUA). This EUA will remain in effect (meaning this test can be used) for the duration of the COVID-19 declaration under Section 564(b)(1) of the Act, 21 U.S.C. section 360bbb-3(b)(1), unless the authorization is terminated or revoked.  Performed at [facility], [address] [place], [place] [address]   MRSA Next Gen by PCR, Nasal     Status: None   Collection Time: 11/26/22  2:03 PM   Specimen: Nasal Mucosa; Nasal Swab  Result Value Ref Range Status  MRSA by PCR Next Gen NOT DETECTED NOT DETECTED Final    Comment: (NOTE) The GeneXpert MRSA Assay (FDA approved for NASAL specimens only), is one component of a comprehensive MRSA colonization surveillance program. It is not intended to diagnose MRSA infection nor to guide or monitor treatment for MRSA infections. Test performance is not FDA approved in patients less than 18 years old. Performed at [facility], [address] [place],  [address]     Coagulation Studies: No results for input(s): "LABPROT", "INR" in the last 72 hours.  Urinalysis: No results for input(s): "COLORURINE", "LABSPEC", "PHURINE", "GLUCOSEU", "HGBUR", "BILIRUBINUR", "KETONESUR", "PROTEINUR", "UROBILINOGEN", "NITRITE", "LEUKOCYTESUR" in the last 72 hours.  Invalid input(s): "APPERANCEUR"     Imaging: No results found.   Medications:    sodium chloride (hypertonic) 35 mL/hr at 11/27/22 0500    calcium carbonate  1 tablet Oral TID WC   Chlorhexidine Gluconate Cloth  6 each Topical Daily   diclofenac Sodium  4 g Topical QID   gabapentin  300 mg Oral TID   heparin  5,000 Units Subcutaneous Q8H   hydrALAZINE  100 mg Oral Q8H   PARoxetine  30 mg Oral Daily   polyethylene glycol  34 g Oral BID   sodium chloride flush  3 mL Intravenous Q12H   sodium chloride  1 g Oral TID WC    acetaminophen **OR** acetaminophen, ALPRAZolam, fluticasone, hydrALAZINE, HYDROcodone-acetaminophen, ondansetron **OR** ondansetron (ZOFRAN) IV  Assessment/ Plan:  Mr. MUSTAF FOUTCH is a 62 y.o.  male with past medical continues including hyperlipidemia, GERD, hypertension, lumbar disc disease, and diverticulosis, who was admitted to [facility] on 11/20/2022 for Hypocalcemia [E83.51] Nausea [R11.0] Acute renal failure ([place]) [N17.9] AKI (acute kidney injury) ([place]) [N17.9]    Acute kidney injury likely secondary to severe illness and hypovolemia.  Normal renal function noted on previous labs.  No IV contrast exposure.  Creatinine on ED arrival 7.82 with GFR 7.  Renal ultrasound negative for obstruction.   Renal function remains at bedside  Lab Results  Component Value Date   CREATININE 0.73 11/27/2022   CREATININE 0.80 11/26/2022   CREATININE 0.85 11/25/2022    Intake/Output Summary (Last 24 hours) at 11/27/2022 0944 Last data filed at 11/27/2022 0500 Gross per 24 hour  Intake 3064.1 ml  Output 700 ml  Net 2364.1 ml    2.  Severe hyponatremia, sodium on admission 113. Due to consistent hyponatremia not responsive to IVF, patient was started on hypertonic saline on 11/26/22. Sodium improving to 124 today. Will continue at current rate. Goal of correction 8 per day.   3.  Hypertension, essential.  Home regimen currently telmisartan-hydrochlorothiazide.  Blood pressure slightly elevated, primary team has resumed Hydralazine.    LOS: 7   3/19/20249:44 AM

## 2022-11-27 NOTE — Progress Notes (Signed)
PROGRESS NOTE    Arthur Patterson  [id number] DOB: Mar 01, 1961 DOA: 11/20/2022 PCP: Rusty Aus, MD  IC12A/IC12A-AA  LOS: 7 days   Brief hospital course:   Assessment & Plan: Arthur Patterson is a 62 y.o. male with medical history significant of hypertension, lumbar disc disease who presented to the ED due to abdominal pain.   Arthur Patterson stated that on 11/16/2022, he began to experience generalized malaise and nausea.  The next day, he began to have multiple episodes of nonbilious, nonbloody vomit.  He states that he has had at least 5+ episodes of vomiting per day since then.  In addition, he has had diarrhea intermittently.  Arthur Patterson stated that he was taking meloxicam nearly every day for several months but his last dose was on 3/6. He has been still taking his home antihypertensives. He denies any daily alcohol use.    * Acute renal failure [facility]) Patient presenting with creatinine of 7.8, previously within normal limits.  Most likely secondary to significant hypovolemia in the setting of acute pancreatitis, and N/V/D.  Last BMP obtained 1 week ago prior to symptom onset with creatinine of 1.06.  - Nephrology consulted --Cr improving with IVF, now back to baseline 0.85  Acute pancreatitis with developing necrosis Patient presenting with nausea and vomiting for 3 to 4 days in the setting of elevated lipase and evidence of markedly inflamed pancreas on CT imaging.  Etiology uncertain at this time as patient declines any recent alcohol use and no gallstones or gallbladder thickening seen on CT imaging and no biliary dilation.  Triglycerides 199 on presentation.  Only medication he takes that is known to cause pancreatitis is simvastatin. --s/p aggressive IVF. --due to worsening leukocytosis, repeat CT a/p obtained on 3/15 which showed "Acute necrotizing pancreatitis with an approximately 3 cm area of non enhancement involving the pancreatic body."  Since no signs of infection  currently, no need for ppx abx, agreed with curbside GI. Plan: --If fever develops, then obtain blood cx and start meropenem. --repeat CT scan in 4 weeks  Hyponatremia 2/2 hypovolemia Na 113 on presentation.  In the setting of profound dehydration and acute pancreatitis.  Despite renal recovery and continuous IVF since presentation, sodium remained around 120. --started on 3% hypertonic saline@30  on 3/18, per nephro Plan: --cont 3% hypertonic saline per nephro --Na check q4h per protocol  Hypocalcemia Likely secondary to pancreatitis.  No EKG changes or tetany on exam. --s/p IV cal gluconate x15 g.  Ca persistently around 7's Plan: --hold further IV Ca suppl --cont oral Ca suppl  Hypomag --monitor and replete PRN with IV mag  Anxiety - Continue home paroxetine and Xanax as needed  Essential hypertension - Hold home telmisartan-HCTZ in the setting of AKI, started on oral hydralazine instead. --cont hydralazine 100 mg q8h  --IV hydralazine PRN  OSA (obstructive sleep apnea) - CPAP at bedtime  Chronic back pain --worsened with acute illness, now much better controlled --Norco PRN --cont home gabapentin (monitor for sedation) --Voltaren gel   DVT prophylaxis: Heparin SQ Code Status: Full code  Family Communication:  Level of care: Stepdown Dispo:   The patient is from: home Anticipated d/c is to: home Anticipated d/c date is: to be determined by nephro depending on Na level   Subjective and Interval History:  No new complaint today.   Started on 3% saline yesterday.  Na increased by about 4 points in 1 day.   Objective: Vitals:   11/27/22 0300 11/27/22 0400  11/27/22 0500 11/27/22 1000  BP: (!) 149/96 (!) 158/97 (!) 154/100 (!) 159/87  Pulse: 98 97 99 (!) 109  Resp: (!) 21 (!) 22 (!) 22 (!) 24  Temp: 99 F (37.2 C)   99.1 F (37.3 C)  TempSrc: Oral   Oral  SpO2: 95% 96% 93% 96%  Weight:   104.1 kg   Height:        Intake/Output Summary (Last 24 hours)  at 11/27/2022 1803 Last data filed at 11/27/2022 1534 Gross per 24 hour  Intake 653.31 ml  Output 200 ml  Net 453.31 ml   Filed Weights   11/26/22 0500 11/26/22 1405 11/27/22 0500  Weight: 103.5 kg 103 kg 104.1 kg    Examination:   Constitutional: NAD, AAOx3 HEENT: conjunctivae and lids normal, EOMI CV: No cyanosis.   RESP: normal respiratory effort, on RA Extremities: swelling in both hands SKIN: warm, dry Neuro: II - XII grossly intact.   Psych: Normal mood and affect.  Appropriate judgement and reason   Data Reviewed: I have personally reviewed labs and imaging studies  Time spent: 35 minutes  Arthur Bi, MD [facility] If 7PM-7AM, please contact night-coverage 11/27/2022, 6:03 PM

## 2022-11-28 ENCOUNTER — Other Ambulatory Visit: Payer: Self-pay

## 2022-11-28 DIAGNOSIS — G8929 Other chronic pain: Secondary | ICD-10-CM | POA: Diagnosis present

## 2022-11-28 DIAGNOSIS — E871 Hypo-osmolality and hyponatremia: Secondary | ICD-10-CM | POA: Diagnosis not present

## 2022-11-28 DIAGNOSIS — K859 Acute pancreatitis without necrosis or infection, unspecified: Secondary | ICD-10-CM | POA: Diagnosis not present

## 2022-11-28 LAB — CBC
HCT: 30.9 % — ABNORMAL LOW (ref 39.0–52.0)
Hemoglobin: 10.6 g/dL — ABNORMAL LOW (ref 13.0–17.0)
MCH: 33.1 pg (ref 26.0–34.0)
MCHC: 34.3 g/dL (ref 30.0–36.0)
MCV: 96.6 fL (ref 80.0–100.0)
Platelets: 361 10*3/uL (ref 150–400)
RBC: 3.2 MIL/uL — ABNORMAL LOW (ref 4.22–5.81)
RDW: 14.8 % (ref 11.5–15.5)
WBC: 20.2 10*3/uL — ABNORMAL HIGH (ref 4.0–10.5)
nRBC: 0 % (ref 0.0–0.2)

## 2022-11-28 LAB — BASIC METABOLIC PANEL
Anion gap: 5 (ref 5–15)
BUN: 15 mg/dL (ref 8–23)
CO2: 23 mmol/L (ref 22–32)
Calcium: 7.5 mg/dL — ABNORMAL LOW (ref 8.9–10.3)
Chloride: 98 mmol/L (ref 98–111)
Creatinine, Ser: 0.72 mg/dL (ref 0.61–1.24)
GFR, Estimated: >60 mL/min (ref >60)
Glucose, Bld: 111 mg/dL — ABNORMAL HIGH (ref 70–99)
Potassium: 3.7 mmol/L (ref 3.5–5.1)
Sodium: 126 mmol/L — ABNORMAL LOW (ref 135–145)

## 2022-11-28 LAB — SODIUM
Sodium: 125 mmol/L — ABNORMAL LOW (ref 135–145)
Sodium: 128 mmol/L — ABNORMAL LOW (ref 135–145)
Sodium: 126 mmol/L — ABNORMAL LOW (ref 135–145)
Sodium: 128 mmol/L — ABNORMAL LOW (ref 135–145)
Sodium: 128 mmol/L — ABNORMAL LOW (ref 135–145)
Sodium: 128 mmol/L — ABNORMAL LOW (ref 135–145)

## 2022-11-28 LAB — MAGNESIUM: Magnesium: 1.9 mg/dL (ref 1.7–2.4)

## 2022-11-28 MED ORDER — SODIUM CHLORIDE 1 G PO TABS
2.0000 g | ORAL_TABLET | Freq: Three times a day (TID) | ORAL | Status: DC
  Administered 2022-11-28 – 2022-11-30 (×7): 2 g via ORAL
  Filled 2022-11-28 (×8): qty 2

## 2022-11-28 NOTE — Progress Notes (Signed)
Progress Note   Patient: Arthur Patterson [id number] DOB: 02-19-1961 DOA: 11/20/2022     8 DOS: the patient was seen and examined on 11/28/2022   Brief hospital course: Arthur Patterson is a 62 y.o. male with medical history significant of hypertension, lumbar disc disease who presented to the ED on 11/20/2022 due to abdominal pain and generalized malaise and nausea since 11/16/2022.  The next day, he began to have multiple episodes of nonbilious, nonbloody vomit.  He states that he has had at least 5+ episodes of vomiting per day since then.  In addition, he has had diarrhea intermittently.  [name] stated that he was taking meloxicam nearly every day for several months but his last dose was on 3/6. He has been still taking his home antihypertensives. He denies any daily alcohol use.   Admitted for acute renal failure with initial Cr of 7.8, Severe hyponatremia (initial sodium 113) and acute pancreatitis.   3/20 -- renal function has recovered.  Remains on 3% saline per nephrology with sodium slowly improving.   Assessment and Plan: * Acute renal failure [facility]) Patient presenting with creatinine of 7.8, previously within normal limits.  Most likely secondary to significant hypovolemia in the setting of acute pancreatitis, and N/V/D.  Last BMP obtained 1 week ago prior to symptom onset with creatinine of 1.06.  - Nephrology consulted --Cr normalized with IV hydration  Acute pancreatitis Patient presenting with nausea and vomiting for 3 to 4 days in the setting of elevated lipase and evidence of markedly inflamed pancreas on CT imaging.  Etiology uncertain at this time as patient declines any recent alcohol use and no gallstones or gallbladder thickening seen on CT imaging and no biliary dilation.  Triglycerides 199 on presentation.  Only medication he takes that is known to cause pancreatitis is simvastatin. --s/p aggressive IVF. --due to worsening leukocytosis, repeat CT a/p obtained on  3/15 which showed "Acute necrotizing pancreatitis with an approximately 3 cm area of non enhancement involving the pancreatic body."  Since no signs of infection currently, no need for ppx abx, agreed with curbside GI. Plan: --If fever develops, then obtain blood cx and start meropenem. --repeat CT scan in 4 weeks  Hyponatremia Na 113 on presentation.  In the setting of profound dehydration and acute pancreatitis.  Despite renal recovery and continuous IVF since presentation, sodium remained around 120. 3/20: Na 120 >> 126 on 3% saline --Mgmt per Nephrology --started on 3% hypertonic saline@30  on 3/18 --Na check q4h per protocol --Close monitoring in stepdown unit  Hypocalcemia Likely secondary to pancreatitis.  No EKG changes or tetany on exam. --s/p IV cal gluconate x15 g.  Ca persistently around 7's Plan: --hold further IV Ca suppl --cont oral Ca suppl  Chronic back pain worsened with acute illness, now much better controlled --Norco PRN --cont home gabapentin (monitor for sedation) --Voltaren gel  Hypomagnesemia monitor and replete PRN with IV ma   Anxiety Continue home paroxetine and Xanax as needed  Essential hypertension --Hold home telmisartan-HCTZ in the setting of AKI, started on oral hydralazine instead. --cont hydralazine 100 mg q8h  --IV hydralazine PRN  OSA (obstructive sleep apnea) - CPAP at bedtime        Subjective: Pt was napping but woke easily when seen this AM.  He reports feeling fatigued, but otherwise feeling much better.  Reports abdomen feels tight and distended but denies currently having nausea or vomiting. No other acute complaints   Physical Exam: Vitals:   11/28/22  0700 11/28/22 0750 11/28/22 0800 11/28/22 0900  BP: (!) 133/101   (!) 150/91  Pulse: (!) 105  (!) 114 (!) 109  Resp: 20  (!) 25 19  Temp:  98.9 F (37.2 C)    TempSrc:  Oral    SpO2: 94%  96% 94%  Weight:      Height:       General exam: awake, alert, no acute  distress HEENT: moist mucus membranes, hearing grossly normal  Respiratory system: CTAB diminished bases due to poor inspirations, no wheezes, normal respiratory effort. Cardiovascular system: normal S1/S2, RRR, nonpitting BLE edema.   Gastrointestinal system: distended, non-tender, bowel sounds present Central nervous system: A&O x3. no gross focal neurologic deficits, normal speech Extremities: moves all, BLE nonpitting edema, normal tone Skin: dry, intact, normal temperature Psychiatry: normal mood, congruent affect, judgement and insight appear normal    Data Reviewed:  Notable labs ---  Na 120>>124>>...126.  glucose 111, Ca 7.5, WBC 20.2, Hbg 10.6 stable   Family Communication: None present  Disposition: Status is: Inpatient Remains inpatient appropriate because: remains on hypertonic saline infusion   Planned Discharge Destination: Home    Time spent: 42 minutes  Author: Ezekiel Slocumb, DO 11/28/2022 3:49 PM  For on call review [email or web address].

## 2022-11-28 NOTE — Progress Notes (Signed)
PICC line ordered for administration of hypertonic saline- Nephrology approval received from Dr. Juleen China and by primary Dr. Arbutus Ped.

## 2022-11-28 NOTE — Hospital Course (Addendum)
Arthur Patterson is a 62 y.o. male with medical history significant of hypertension, lumbar disc disease who presented to the ED on 11/20/2022 due to abdominal pain and generalized malaise and nausea since 11/16/2022.  The next day, he began to have multiple episodes of nonbilious, nonbloody vomit.  He states that he has had at least 5+ episodes of vomiting per day since then.  In addition, he has had diarrhea intermittently.  Arthur Patterson stated that he was taking meloxicam nearly every day for several months but his last dose was on 3/6. He has been still taking his home antihypertensives. He denies any daily alcohol use.   Admitted for acute renal failure with initial Cr of 7.8, Severe hyponatremia (initial sodium 113) and acute pancreatitis.   3/20 -- renal function has recovered.  Remains on 3% saline per nephrology with sodium slowly improving. 3/21 -- Na 129, stopped 3% saline, transfer out of ICU. Ongoing abdominal distention 3/22 -- Na 133>>132 this AM.  U/S and repeat CT for evaluation of distention.

## 2022-11-28 NOTE — Assessment & Plan Note (Signed)
monitor and replete PRN with IV ma

## 2022-11-28 NOTE — Assessment & Plan Note (Addendum)
worsened with acute illness, now much better controlled.  Lumbar surgery was planned but cancelled due to hyponatremia on pre-op labs --Norco PRN --cont home gabapentin (monitor for sedation) --Voltaren gel

## 2022-11-28 NOTE — Progress Notes (Signed)
[facility]  ROUNDING NOTE   Subjective:   Patient resting quietly in ICU Alert and oriented Appetite remains appropriate  Abdomen appears more distended, firm Patient reports large BM this morning  Sodium 126(124)  Objective:  Vital signs in last 24 hours:  Temp:  [97.9 F (36.6 C)-99.2 F (37.3 C)] 98.9 F (37.2 C) (03/20 0750) Pulse Rate:  [92-114] 109 (03/20 0900) Resp:  [16-25] 19 (03/20 0900) BP: (119-158)/(68-109) 150/91 (03/20 0900) SpO2:  [92 %-97 %] 94 % (03/20 0900)  Weight change:  Filed Weights   11/26/22 0500 11/26/22 1405 11/27/22 0500  Weight: 103.5 kg 103 kg 104.1 kg    Intake/Output: I/O last 3 completed shifts: In: 1613.5 [P.O.:540; I.V.:1073.5] Out: 875 [Urine:875]   Intake/Output this shift:  Total I/O In: 62.7 [I.V.:62.7] Out: -   Physical Exam: General: NAD  Head: Normocephalic, atraumatic. Moist oral mucosal membranes  Eyes: Anicteric  Lungs:  expiratory wheeze, normal effort  Heart: Regular rate and rhythm  Abdomen:  Firm, nontender, distended, tympanic  Extremities: Trace peripheral edema.  Neurologic: Alert and oriented, moving all four extremities  Skin: No lesions  Access: None    Basic Metabolic Panel: Recent Labs  Lab 11/22/22 0722 11/23/22 0543 11/24/22 0511 11/25/22 0432 11/26/22 0505 11/26/22 1032 11/27/22 0507 11/27/22 0714 11/27/22 1444 11/27/22 1942 11/27/22 2259 11/28/22 0304 11/28/22 0435  NA 120*   < > 120* 120* 119*   < > 120*   < > 125* 126* 125* 125* 126*  K 3.3*   < > 2.9* 3.1* 3.0*  --  3.4*  --   --   --   --   --  3.7  CL 91*   < > 87* 84* 88*  --  91*  --   --   --   --   --  98  CO2 21*   < > 23 24 22   --  22  --   --   --   --   --  23  GLUCOSE 91   < > 95 88 90  --  105*  --   --   --   --   --  111*  BUN 28*   < > 10 10 12   --  14  --   --   --   --   --  15  CREATININE 1.94*   < > 1.01 0.85 0.80  --  0.73  --   --   --   --   --  0.72  CALCIUM 6.2*   < > 7.0* 7.4* 7.2*  --   7.2*  --   --   --   --   --  7.5*  MG 2.3   < > 1.9 1.7 1.6*  --  1.9  --   --   --   --   --  1.9  PHOS 2.6  --   --   --   --   --   --   --   --   --   --   --   --    < > = values in this interval not displayed.     Liver Function Tests: No results for input(s): "AST", "ALT", "ALKPHOS", "BILITOT", "PROT", "ALBUMIN" in the last 168 hours.  Recent Labs  Lab 11/22/22 0722  LIPASE 41    No results for input(s): "AMMONIA" in the last 168 hours.  CBC: Recent Labs  Lab  11/24/22 0511 11/25/22 0432 11/26/22 0505 11/27/22 0507 11/28/22 0435  WBC 23.6* 21.7* 21.2* 21.0* 20.2*  HGB 10.4* 10.7* 10.5* 10.7* 10.6*  HCT 29.7* 30.7* 29.6* 30.8* 30.9*  MCV 94.0 94.2 93.7 95.1 96.6  PLT 236 296 299 340 361     Cardiac Enzymes: No results for input(s): "CKTOTAL", "CKMB", "CKMBINDEX", "TROPONINI" in the last 168 hours.  BNP: Invalid input(s): "POCBNP"  CBG: Recent Labs  Lab 11/22/22 1953  [place] 77*     Microbiology: Results for orders placed or performed during the hospital encounter of 11/20/22  Resp panel by RT-PCR (RSV, Flu A&B, Covid) Anterior Nasal Swab     Status: None   Collection Time: 11/20/22 12:01 PM   Specimen: Anterior Nasal Swab  Result Value Ref Range Status   SARS Coronavirus 2 by RT PCR NEGATIVE NEGATIVE Final    Comment: (NOTE) SARS-CoV-2 target nucleic acids are NOT DETECTED.  The SARS-CoV-2 RNA is generally detectable in upper respiratory specimens during the acute phase of infection. The lowest concentration of SARS-CoV-2 viral copies this assay can detect is 138 copies/mL. A negative result does not preclude SARS-Cov-2 infection and should not be used as the sole basis for treatment or other patient management decisions. A negative result may occur with  improper specimen collection/handling, submission of specimen other than nasopharyngeal swab, presence of viral mutation(s) within the areas targeted by this assay, and inadequate number of  viral copies(<138 copies/mL). A negative result must be combined with clinical observations, patient history, and epidemiological information. The expected result is Negative.  Fact Sheet for Patients:  EntrepreneurPulse.com.au  Fact Sheet for Healthcare Providers:  IncredibleEmployment.be  This test is no t yet approved or cleared by the [place] FDA and  has been authorized for detection and/or diagnosis of SARS-CoV-2 by FDA under an Emergency Use Authorization (EUA). This EUA will remain  in effect (meaning this test can be used) for the duration of the COVID-19 declaration under Section 564(b)(1) of the Act, 21 U.S.C.section 360bbb-3(b)(1), unless the authorization is terminated  or revoked sooner.       Influenza A by PCR NEGATIVE NEGATIVE Final   Influenza B by PCR NEGATIVE NEGATIVE Final    Comment: (NOTE) The Xpert Xpress SARS-CoV-2/FLU/RSV plus assay is intended as an aid in the diagnosis of influenza from Nasopharyngeal swab specimens and should not be used as a sole basis for treatment. Nasal washings and aspirates are unacceptable for Xpert Xpress SARS-CoV-2/FLU/RSV testing.  Fact Sheet for Patients: EntrepreneurPulse.com.au  Fact Sheet for Healthcare Providers: IncredibleEmployment.be  This test is not yet approved or cleared by the [place] FDA and has been authorized for detection and/or diagnosis of SARS-CoV-2 by FDA under an Emergency Use Authorization (EUA). This EUA will remain in effect (meaning this test can be used) for the duration of the COVID-19 declaration under Section 564(b)(1) of the Act, 21 U.S.C. section 360bbb-3(b)(1), unless the authorization is terminated or revoked.     Resp Syncytial Virus by PCR NEGATIVE NEGATIVE Final    Comment: (NOTE) Fact Sheet for Patients: EntrepreneurPulse.com.au  Fact Sheet for Healthcare  Providers: IncredibleEmployment.be  This test is not yet approved or cleared by the [place] FDA and has been authorized for detection and/or diagnosis of SARS-CoV-2 by FDA under an Emergency Use Authorization (EUA). This EUA will remain in effect (meaning this test can be used) for the duration of the COVID-19 declaration under Section 564(b)(1) of the Act, 21 U.S.C. section 360bbb-3(b)(1), unless the authorization is terminated  or revoked.  Performed at [facility], [address] [place], [place] [address]   MRSA Next Gen by PCR, Nasal     Status: None   Collection Time: 11/26/22  2:03 PM   Specimen: Nasal Mucosa; Nasal Swab  Result Value Ref Range Status   MRSA by PCR Next Gen NOT DETECTED NOT DETECTED Final    Comment: (NOTE) The GeneXpert MRSA Assay (FDA approved for NASAL specimens only), is one component of a comprehensive MRSA colonization surveillance program. It is not intended to diagnose MRSA infection nor to guide or monitor treatment for MRSA infections. Test performance is not FDA approved in patients less than 33 years old. Performed at [facility], [address] [place], [place] [address]     Coagulation Studies: No results for input(s): "LABPROT", "INR" in the last 72 hours.  Urinalysis: No results for input(s): "COLORURINE", "LABSPEC", "PHURINE", "GLUCOSEU", "HGBUR", "BILIRUBINUR", "KETONESUR", "PROTEINUR", "UROBILINOGEN", "NITRITE", "LEUKOCYTESUR" in the last 72 hours.  Invalid input(s): "APPERANCEUR"     Imaging: No results found.   Medications:    sodium chloride (hypertonic) 35 mL/hr at 11/28/22 0800    calcium carbonate  1 tablet Oral TID WC   Chlorhexidine Gluconate Cloth  6 each Topical Daily   diclofenac Sodium  4 g Topical QID   enoxaparin (LOVENOX) injection  0.5 mg/kg Subcutaneous Q24H   gabapentin  300 mg Oral TID   hydrALAZINE  100 mg Oral Q8H   PARoxetine  30 mg Oral Daily    polyethylene glycol  34 g Oral BID   sodium chloride flush  3 mL Intravenous Q12H   sodium chloride  2 g Oral TID WC   acetaminophen **OR** acetaminophen, ALPRAZolam, fluticasone, hydrALAZINE, HYDROcodone-acetaminophen, ondansetron **OR** ondansetron (ZOFRAN) IV  Assessment/ Plan:  Arthur Patterson is a 62 y.o.  male with past medical continues including hyperlipidemia, GERD, hypertension, lumbar disc disease, and diverticulosis, who was admitted to [facility] on 11/20/2022 for Hypocalcemia [E83.51] Nausea [R11.0] Acute renal failure ([place]) [N17.9] AKI (acute kidney injury) ([place]) [N17.9]    Acute kidney injury likely secondary to severe illness and hypovolemia.  Normal renal function noted on previous labs.  No IV contrast exposure.  Creatinine on ED arrival 7.82 with GFR 7.  Renal ultrasound negative for obstruction.   Creatinine remained stable at baseline.  Lab Results  Component Value Date   CREATININE 0.72 11/28/2022   CREATININE 0.73 11/27/2022   CREATININE 0.80 11/26/2022    Intake/Output Summary (Last 24 hours) at 11/28/2022 1052 Last data filed at 11/28/2022 0800 Gross per 24 hour  Intake 1419.91 ml  Output 675 ml  Net 744.91 ml    2.  Severe hyponatremia, sodium on admission 113.   3% hypertonic saline started on 11/26/2022 due to persistent hyponatremia.  Sodium corrected to 126 today.  Outpatient lab indicate patient has been around sodium level of 130 chronically.  Will continue hypertonic saline for now and will consider discontinuing once sodium reaches 130. Okay to continue salt tabs.  3.  Hypertension, essential.  Home regimen currently telmisartan-hydrochlorothiazide.  Blood pressure slightly elevated, primary team has resumed Hydralazine.    LOS: 8   3/20/202410:52 AM

## 2022-11-28 NOTE — Progress Notes (Signed)
Peripherally Inserted Central Catheter Placement  The IV Nurse has discussed with the patient and/or persons authorized to consent for the patient, the purpose of this procedure and the potential benefits and risks involved with this procedure.  The benefits include less needle sticks, lab draws from the catheter, and the patient may be discharged home with the catheter. Risks include, but not limited to, infection, bleeding, blood clot (thrombus formation), and puncture of an artery; nerve damage and irregular heartbeat and possibility to perform a PICC exchange if needed/ordered by physician.  Alternatives to this procedure were also discussed.     Consent obtained, but PICC placement unsuccessful. Attempt made from right brachial vein. Access of vein and use of introducer without complication, but threading PICC there would be hard-resistance in center chest, approximately 22cm in from insertion site. Multiple attempts were made for advancement of PICC including patient to take deep breaths, coughing, and multiple positional changes with no success.   Primary team notified and IR recommended for further central access.

## 2022-11-28 NOTE — Consult Note (Signed)
MEDICATION RELATED CONSULT NOTE -   Pharmacy Consult for Hypertonic saline monitoring Indication: hyponatremia  No Known Allergies  Patient Measurements: Height: 5\' 9"  (175.3 cm) Weight: 104.1 kg (229 lb 8 oz) IBW/kg (Calculated) : 70.7   Labs:    Latest Ref Rng & Units 11/28/2022    6:47 PM 11/28/2022    2:12 PM 11/28/2022   11:53 AM  CMP  Sodium 135 - 145 mmol/L 128  128  126     Estimated Creatinine Clearance: 112.4 mL/min (by C-G formula based on SCr of 0.72 mg/dL).  Medical History: Past Medical History:  Diagnosis Date   Anxiety    Family history of adverse reaction to anesthesia    FAther had trouble waking   Hypertension    Sleep apnea    Spondylolisthesis at L4-L5 level     Medications:   -Sodium Chloride 3% @ 35 ml/hr -NaCl 1 gram po TID  Assessment: 62 yo male who presented to ED due to abdominal pain.  PMH includes HTN, HLD, GERD, diverticulosis.  Patient found to have acute pancreatitis and acute kidney injury.  Patient also has multiple electrolyte abnormality including hyponatremia, hypomagnesimia, hypocalcemia, and hypokalemia.  Pharmacy consulted to monitor while on hypertonic saline infusion.  Results: 03/18 0505 Na 119 03/18 1032 Na 121 03/18 2300 Na 120 03/19 0317 Na 121 03/19 0751 Na 124 03/19 1146 Na 125 03/19 1144 Na 125 03/19 1942 Na 125 03/19 2259 Na 125 03/20 0304 Na 126 03/20 0435 Na 126 03/20 0950 Na 126   -NaCL 2 gm po tabs ordered TID 03/20 1153 Na 126 03/20 1847 Na 128  Goal of Therapy:  -Increase of no more than 10-12 mEq/L in 24 hours  Plan: -Infusion rate maintained at 35 mL/hr per nephrology -Continue rechecking Na q4hrs while on hypertonic saline infusion. -Stop hypertonic infusion and contact provider if increase of greater than 10 mEq in 24 hours.  Chinita Greenland PharmD Clinical Pharmacist 11/28/2022

## 2022-11-28 NOTE — Consult Note (Signed)
MEDICATION RELATED CONSULT NOTE - INITIAL   Pharmacy Consult for Hypertonic saline monitoring Indication: hyponatremia  No Known Allergies  Patient Measurements: Height: 5\' 9"  (175.3 cm) Weight: 104.1 kg (229 lb 8 oz) IBW/kg (Calculated) : 70.7   Labs:    Latest Ref Rng & Units 11/28/2022   11:53 AM 11/28/2022    9:50 AM 11/28/2022    4:35 AM  CMP  Glucose 70 - 99 mg/dL   111   BUN 8 - 23 mg/dL   15   Creatinine 0.61 - 1.24 mg/dL   0.72   Sodium 135 - 145 mmol/L 126  128  126   Potassium 3.5 - 5.1 mmol/L   3.7   Chloride 98 - 111 mmol/L   98   CO2 22 - 32 mmol/L   23   Calcium 8.9 - 10.3 mg/dL   7.5     Estimated Creatinine Clearance: 112.4 mL/min (by C-G formula based on SCr of 0.72 mg/dL).  Medical History: Past Medical History:  Diagnosis Date   Anxiety    Family history of adverse reaction to anesthesia    FAther had trouble waking   Hypertension    Sleep apnea    Spondylolisthesis at L4-L5 level     Medications:   -Sodium Chloride 3% @ 35 ml/hr -NaCl 1 gram po TID  Assessment: 62 yo male who presented to ED due to abdominal pain.  PMH includes HTN, HLD, GERD, diverticulosis.  Patient found to have acute pancreatitis and acute kidney injury.  Patient also has multiple electrolyte abnormality including hyponatremia, hypomagnesimia, hypocalcemia, and hypokalemia.  Pharmacy consulted to monitor while on hypertonic saline infusion.  Results: 03/18 0505 Na 119 03/18 1032 Na 121 03/18 2300 Na 120 03/19 0317 Na 121 03/19 0751 Na 124 03/19 1146 Na 125 03/19 1144 Na 125 03/19 1942 Na 125 03/19 2259 Na 125 03/20 0304 Na 126 03/20 0435 Na 126 03/20 0950 Na 126  03/20 1153 Na 126  Goal of Therapy:  -Increase of no more than 10-12 mEq/L in 24 hours  Plan: -Infusion rate maintained at 35 mL/hr per nephrology -Continue rechecking Na q4hrs while on hypertonic saline infusion. -Stop hypertonic infusion and contact provider if increase of greater than 10 mEq in  24 hours.  Vallery Sa, PharmD, BCPS 11/28/2022 12:56 PM

## 2022-11-28 NOTE — Plan of Care (Signed)

## 2022-11-29 DIAGNOSIS — E871 Hypo-osmolality and hyponatremia: Secondary | ICD-10-CM | POA: Diagnosis not present

## 2022-11-29 DIAGNOSIS — K859 Acute pancreatitis without necrosis or infection, unspecified: Secondary | ICD-10-CM | POA: Diagnosis not present

## 2022-11-29 LAB — CBC
HCT: 31.6 % — ABNORMAL LOW (ref 39.0–52.0)
Hemoglobin: 10.7 g/dL — ABNORMAL LOW (ref 13.0–17.0)
MCH: 33.1 pg (ref 26.0–34.0)
MCHC: 33.9 g/dL (ref 30.0–36.0)
MCV: 97.8 fL (ref 80.0–100.0)
Platelets: 351 10*3/uL (ref 150–400)
RBC: 3.23 MIL/uL — ABNORMAL LOW (ref 4.22–5.81)
RDW: 15 % (ref 11.5–15.5)
WBC: 15.6 10*3/uL — ABNORMAL HIGH (ref 4.0–10.5)
nRBC: 0 % (ref 0.0–0.2)

## 2022-11-29 LAB — SODIUM
Sodium: 129 mmol/L — ABNORMAL LOW (ref 135–145)
Sodium: 128 mmol/L — ABNORMAL LOW (ref 135–145)
Sodium: 129 mmol/L — ABNORMAL LOW (ref 135–145)
Sodium: 127 mmol/L — ABNORMAL LOW (ref 135–145)

## 2022-11-29 LAB — BASIC METABOLIC PANEL
Anion gap: 8 (ref 5–15)
BUN: 17 mg/dL (ref 8–23)
CO2: 21 mmol/L — ABNORMAL LOW (ref 22–32)
Calcium: 7.8 mg/dL — ABNORMAL LOW (ref 8.9–10.3)
Chloride: 101 mmol/L (ref 98–111)
Creatinine, Ser: 0.63 mg/dL (ref 0.61–1.24)
GFR, Estimated: >60 mL/min (ref >60)
Glucose, Bld: 87 mg/dL (ref 70–99)
Potassium: 3.4 mmol/L — ABNORMAL LOW (ref 3.5–5.1)
Sodium: 130 mmol/L — ABNORMAL LOW (ref 135–145)

## 2022-11-29 LAB — MAGNESIUM: Magnesium: 1.7 mg/dL (ref 1.7–2.4)

## 2022-11-29 MED ORDER — POLYETHYLENE GLYCOL 3350 17 G PO PACK: 34.0000 g | PACK | Freq: Every day | ORAL | Status: DC | PRN

## 2022-11-29 MED ORDER — POTASSIUM CHLORIDE CRYS ER 20 MEQ PO TBCR
40.0000 meq | EXTENDED_RELEASE_TABLET | Freq: Once | ORAL | Status: AC
  Administered 2022-11-29: 40 meq via ORAL
  Filled 2022-11-29: qty 2

## 2022-11-29 MED ORDER — HYDROCODONE-ACETAMINOPHEN 5-325 MG PO TABS
1.0000 | ORAL_TABLET | ORAL | Status: DC | PRN
  Administered 2022-11-29 – 2022-11-30 (×2): 2 via ORAL
  Administered 2022-11-30 (×2): 1 via ORAL
  Administered 2022-12-01 – 2022-12-02 (×5): 2 via ORAL
  Filled 2022-11-29 (×3): qty 2
  Filled 2022-11-29: qty 1
  Filled 2022-11-29 (×5): qty 2

## 2022-11-29 MED ORDER — HYDROCORTISONE ACETATE 25 MG RE SUPP
25.0000 mg | Freq: Two times a day (BID) | RECTAL | Status: DC
  Administered 2022-11-29 – 2022-11-30 (×3): 25 mg via RECTAL
  Filled 2022-11-29 (×7): qty 1

## 2022-11-29 NOTE — Progress Notes (Signed)
Progress Note   Patient: Arthur Patterson [id number] DOB: 1960-10-24 DOA: 11/20/2022     9 DOS: the patient was seen and examined on 11/29/2022   Brief hospital course: MONTAVION RASCOE is a 62 y.o. male with medical history significant of hypertension, lumbar disc disease who presented to the ED on 11/20/2022 due to abdominal pain and generalized malaise and nausea since 11/16/2022.  The next day, he began to have multiple episodes of nonbilious, nonbloody vomit.  He states that he has had at least 5+ episodes of vomiting per day since then.  In addition, he has had diarrhea intermittently.  Mr. Dupriest stated that he was taking meloxicam nearly every day for several months but his last dose was on 3/6. He has been still taking his home antihypertensives. He denies any daily alcohol use.   Admitted for acute renal failure with initial Cr of 7.8, Severe hyponatremia (initial sodium 113) and acute pancreatitis.   3/20 -- renal function has recovered.  Remains on 3% saline per nephrology with sodium slowly improving.   Assessment and Plan: * Acute renal failure [facility]) Patient presenting with creatinine of 7.8, previously within normal limits.  Most likely secondary to significant hypovolemia in the setting of acute pancreatitis, and N/V/D.  Last BMP obtained 1 week ago prior to symptom onset with creatinine of 1.06.  - Nephrology consulted --Cr normalized with IV hydration  Acute pancreatitis Patient presenting with nausea and vomiting for 3 to 4 days in the setting of elevated lipase and evidence of markedly inflamed pancreas on CT imaging.  Etiology uncertain at this time as patient declines any recent alcohol use and no gallstones or gallbladder thickening seen on CT imaging and no biliary dilation.  Triglycerides 199 on presentation.  Only medication he takes that is known to cause pancreatitis is simvastatin. --s/p aggressive IVF. --due to worsening leukocytosis, repeat CT a/p obtained on  3/15 which showed "Acute necrotizing pancreatitis with an approximately 3 cm area of non enhancement involving the pancreatic body."  Since no signs of infection currently, no need for ppx abx, agreed with curbside GI. Plan: --If fever develops, then obtain blood cx and start meropenem. --repeat CT scan in 4 weeks  Hyponatremia Na 113 on presentation.  In the setting of profound dehydration and acute pancreatitis.  Despite renal recovery and continuous IVF since presentation, sodium remained around 120. 3/20: Na 120 >> 126 on 3% saline 3/21: Na 129, stopped 3% saline --Mgmt per Nephrology --Stop 3% saline this AM --Transfer to tele/med floor --Serial BMP's --On salt tablets  Hypocalcemia Likely secondary to pancreatitis.  No EKG changes or tetany on exam. --s/p IV cal gluconate x15 g.  Ca persistently around 7's Plan: --hold further IV Ca suppl --cont oral Ca suppl  Chronic back pain worsened with acute illness, now much better controlled --Norco PRN --cont home gabapentin (monitor for sedation) --Voltaren gel  Hypomagnesemia monitor and replete PRN with IV ma   Anxiety Continue home paroxetine and Xanax as needed  Essential hypertension --Hold home telmisartan-HCTZ in the setting of AKI, started on oral hydralazine instead. --cont hydralazine 100 mg q8h  --IV hydralazine PRN  OSA (obstructive sleep apnea) - CPAP at bedtime        Subjective: Pt was seen in ICU this AM, now off 3% saline infusion.  He reports feeling a lot better today.  He denies abdominal symptoms throughout this illness, tolerating diet with no N/V.  He reports abdomen does feel distended and bloated.  No other acute complaints but hopes to go home soon.  Physical Exam: Vitals:   11/29/22 0600 11/29/22 0700 11/29/22 0800 11/29/22 0900  BP: (!) 133/91 135/87 131/86 (!) 141/93  Pulse: (!) 101 (!) 101 90 99  Resp: 14 14 16 20   Temp:    98.7 F (37.1 C)  TempSrc:      SpO2: 93% 95% 97% 95%   Weight:      Height:       General exam: awake, alert, no acute distress HEENT: moist mucus membranes, hearing grossly normal  Respiratory system: on room air, normal respiratory effort. Cardiovascular system: normal S1/S2, RRR, nonpitting BLE edema.   Gastrointestinal system: very distended, non-tender, bowel sounds present Central nervous system: A&O x3. no gross focal neurologic deficits, normal speech Extremities: moves all, nonpitting edema of all extremities, normal tone Skin: dry, intact, normal temperature Psychiatry: normal mood, congruent affect, judgement and insight appear normal    Data Reviewed:  Notable labs ---  Na 126 >> 129 this AM.  K 3.4, Ca 7.8, WBC 15.6 improved from 20.2k.  Hbg stable 10.7.   Family Communication: None present. Will attempt to call this afternoon.  Disposition: Status is: Inpatient Remains inpatient appropriate because: persistent electrolyte derangements requiring close monitoring.    Planned Discharge Destination: Home    Time spent: 38 minutes  Author: Ezekiel Slocumb, DO 11/29/2022 12:13 PM  For on call review [email or web address].

## 2022-11-29 NOTE — Progress Notes (Signed)
[facility]  ROUNDING NOTE   Subjective:   Sodium 130.  Off hypertonic saline.   Objective:  Vital signs in last 24 hours:  Temp:  [98.3 F (36.8 C)-98.8 F (37.1 C)] 98.7 F (37.1 C) (03/21 0900) Pulse Rate:  [90-107] 99 (03/21 0900) Resp:  [12-32] 20 (03/21 0900) BP: (127-148)/(81-94) 141/93 (03/21 0900) SpO2:  [90 %-99 %] 95 % (03/21 0900)  Weight change:  Filed Weights   11/26/22 0500 11/26/22 1405 11/27/22 0500  Weight: 103.5 kg 103 kg 104.1 kg    Intake/Output: I/O last 3 completed shifts: In: 2109.4 [P.O.:780; I.V.:1329.4] Out: 950 [Urine:950]   Intake/Output this shift:  No intake/output data recorded.  Physical Exam: General: NAD  Head: Normocephalic, atraumatic. Moist oral mucosal membranes  Eyes: Anicteric  Lungs:  normal effort  Heart: Regular rate and rhythm  Abdomen:  Firm, nontender, distended, tympanic  Extremities: Trace peripheral edema.  Neurologic: Alert and oriented, moving all four extremities  Skin: No lesions  Access: None    Basic Metabolic Panel: Recent Labs  Lab 11/25/22 0432 11/26/22 0505 11/26/22 1032 11/27/22 0507 11/27/22 0714 11/28/22 0435 11/28/22 0950 11/28/22 1412 11/28/22 1847 11/28/22 2314 11/29/22 0258 11/29/22 0845  NA 120* 119*   < > 120*   < > 126*   < > 128* 128* 128* 129* 130*  K 3.1* 3.0*  --  3.4*  --  3.7  --   --   --   --   --  3.4*  CL 84* 88*  --  91*  --  98  --   --   --   --   --  101  CO2 24 22  --  22  --  23  --   --   --   --   --  21*  GLUCOSE 88 90  --  105*  --  111*  --   --   --   --   --  87  BUN 10 12  --  14  --  15  --   --   --   --   --  17  CREATININE 0.85 0.80  --  0.73  --  0.72  --   --   --   --   --  0.63  CALCIUM 7.4* 7.2*  --  7.2*  --  7.5*  --   --   --   --   --  7.8*  MG 1.7 1.6*  --  1.9  --  1.9  --   --   --   --   --  1.7   < > = values in this interval not displayed.     Liver Function Tests: No results for input(s): "AST", "ALT", "ALKPHOS",  "BILITOT", "PROT", "ALBUMIN" in the last 168 hours.  No results for input(s): "LIPASE", "AMYLASE" in the last 168 hours.  No results for input(s): "AMMONIA" in the last 168 hours.  CBC: Recent Labs  Lab 11/25/22 0432 11/26/22 0505 11/27/22 0507 11/28/22 0435 11/29/22 0845  WBC 21.7* 21.2* 21.0* 20.2* 15.6*  HGB 10.7* 10.5* 10.7* 10.6* 10.7*  HCT 30.7* 29.6* 30.8* 30.9* 31.6*  MCV 94.2 93.7 95.1 96.6 97.8  PLT 296 299 340 361 351     Cardiac Enzymes: No results for input(s): "CKTOTAL", "CKMB", "CKMBINDEX", "TROPONINI" in the last 168 hours.  BNP: Invalid input(s): "POCBNP"  CBG: Recent Labs  Lab 11/22/22 1953  GLUCAP 158*  Microbiology: Results for orders placed or performed during the hospital encounter of 11/20/22  Resp panel by RT-PCR (RSV, Flu A&B, Covid) Anterior Nasal Swab     Status: None   Collection Time: 11/20/22 12:01 PM   Specimen: Anterior Nasal Swab  Result Value Ref Range Status   SARS Coronavirus 2 by RT PCR NEGATIVE NEGATIVE Final    Comment: (NOTE) SARS-CoV-2 target nucleic acids are NOT DETECTED.  The SARS-CoV-2 RNA is generally detectable in upper respiratory specimens during the acute phase of infection. The lowest concentration of SARS-CoV-2 viral copies this assay can detect is 138 copies/mL. A negative result does not preclude SARS-Cov-2 infection and should not be used as the sole basis for treatment or other patient management decisions. A negative result may occur with  improper specimen collection/handling, submission of specimen other than nasopharyngeal swab, presence of viral mutation(s) within the areas targeted by this assay, and inadequate number of viral copies(<138 copies/mL). A negative result must be combined with clinical observations, patient history, and epidemiological information. The expected result is Negative.  Fact Sheet for Patients:  EntrepreneurPulse.com.au  Fact Sheet for Healthcare  Providers:  IncredibleEmployment.be  This test is no t yet approved or cleared by the [place] FDA and  has been authorized for detection and/or diagnosis of SARS-CoV-2 by FDA under an Emergency Use Authorization (EUA). This EUA will remain  in effect (meaning this test can be used) for the duration of the COVID-19 declaration under Section 564(b)(1) of the Act, 21 U.S.C.section 360bbb-3(b)(1), unless the authorization is terminated  or revoked sooner.       Influenza A by PCR NEGATIVE NEGATIVE Final   Influenza B by PCR NEGATIVE NEGATIVE Final    Comment: (NOTE) The Xpert Xpress SARS-CoV-2/FLU/RSV plus assay is intended as an aid in the diagnosis of influenza from Nasopharyngeal swab specimens and should not be used as a sole basis for treatment. Nasal washings and aspirates are unacceptable for Xpert Xpress SARS-CoV-2/FLU/RSV testing.  Fact Sheet for Patients: EntrepreneurPulse.com.au  Fact Sheet for Healthcare Providers: IncredibleEmployment.be  This test is not yet approved or cleared by the [place] FDA and has been authorized for detection and/or diagnosis of SARS-CoV-2 by FDA under an Emergency Use Authorization (EUA). This EUA will remain in effect (meaning this test can be used) for the duration of the COVID-19 declaration under Section 564(b)(1) of the Act, 21 U.S.C. section 360bbb-3(b)(1), unless the authorization is terminated or revoked.     Resp Syncytial Virus by PCR NEGATIVE NEGATIVE Final    Comment: (NOTE) Fact Sheet for Patients: EntrepreneurPulse.com.au  Fact Sheet for Healthcare Providers: IncredibleEmployment.be  This test is not yet approved or cleared by the [place] FDA and has been authorized for detection and/or diagnosis of SARS-CoV-2 by FDA under an Emergency Use Authorization (EUA). This EUA will remain in effect (meaning this test can be  used) for the duration of the COVID-19 declaration under Section 564(b)(1) of the Act, 21 U.S.C. section 360bbb-3(b)(1), unless the authorization is terminated or revoked.  Performed at [facility], [address] [place],  [address]   MRSA Next Gen by PCR, Nasal     Status: None   Collection Time: 11/26/22  2:03 PM   Specimen: Nasal Mucosa; Nasal Swab  Result Value Ref Range Status   MRSA by PCR Next Gen NOT DETECTED NOT DETECTED Final    Comment: (NOTE) The GeneXpert MRSA Assay (FDA approved for NASAL specimens only), is one component of a comprehensive MRSA  colonization surveillance program. It is not intended to diagnose MRSA infection nor to guide or monitor treatment for MRSA infections. Test performance is not FDA approved in patients less than 80 years old. Performed at [facility], [address] [place], [place] [address]     Coagulation Studies: No results for input(s): "LABPROT", "INR" in the last 72 hours.  Urinalysis: No results for input(s): "COLORURINE", "LABSPEC", "PHURINE", "GLUCOSEU", "HGBUR", "BILIRUBINUR", "KETONESUR", "PROTEINUR", "UROBILINOGEN", "NITRITE", "LEUKOCYTESUR" in the last 72 hours.  Invalid input(s): "APPERANCEUR"     Imaging: [place] EKG SITE RITE  Result Date: 11/28/2022 If Site Rite image not attached, placement could not be confirmed due to current cardiac rhythm.    Medications:      calcium carbonate  1 tablet Oral TID WC   Chlorhexidine Gluconate Cloth  6 each Topical Daily   diclofenac Sodium  4 g Topical QID   enoxaparin (LOVENOX) injection  0.5 mg/kg Subcutaneous Q24H   gabapentin  300 mg Oral TID   hydrALAZINE  100 mg Oral Q8H   PARoxetine  30 mg Oral Daily   polyethylene glycol  34 g Oral BID   sodium chloride flush  3 mL Intravenous Q12H   sodium chloride  2 g Oral TID WC   acetaminophen **OR** acetaminophen, ALPRAZolam, fluticasone, hydrALAZINE, HYDROcodone-acetaminophen, ondansetron  **OR** ondansetron (ZOFRAN) IV  Assessment/ Plan:  Mr. Arthur Patterson is a 62 y.o.  male with past medical continues including hyperlipidemia, GERD, hypertension, lumbar disc disease, and diverticulosis, who was admitted to [facility] on 11/20/2022 for Hypocalcemia [E83.51] Nausea [R11.0] Acute renal failure ([place]) [N17.9] AKI (acute kidney injury) ([place]) [N17.9]    Hyponatremia: off hypertonic saline. Encourage PO intake and continue salt tabs.     LOS: 9 Monserrath Junio 3/21/202410:55 AM

## 2022-11-30 ENCOUNTER — Inpatient Hospital Stay: Payer: 59

## 2022-11-30 DIAGNOSIS — R140 Abdominal distension (gaseous): Secondary | ICD-10-CM | POA: Diagnosis not present

## 2022-11-30 DIAGNOSIS — K8591 Acute pancreatitis with uninfected necrosis, unspecified: Secondary | ICD-10-CM | POA: Diagnosis not present

## 2022-11-30 DIAGNOSIS — E871 Hypo-osmolality and hyponatremia: Secondary | ICD-10-CM | POA: Diagnosis not present

## 2022-11-30 LAB — CBC
HCT: 30.7 % — ABNORMAL LOW (ref 39.0–52.0)
Hemoglobin: 10.3 g/dL — ABNORMAL LOW (ref 13.0–17.0)
MCH: 33.2 pg (ref 26.0–34.0)
MCHC: 33.6 g/dL (ref 30.0–36.0)
MCV: 99 fL (ref 80.0–100.0)
Platelets: 385 10*3/uL (ref 150–400)
RBC: 3.1 MIL/uL — ABNORMAL LOW (ref 4.22–5.81)
RDW: 15 % (ref 11.5–15.5)
WBC: 16.3 10*3/uL — ABNORMAL HIGH (ref 4.0–10.5)
nRBC: 0 % (ref 0.0–0.2)

## 2022-11-30 LAB — BASIC METABOLIC PANEL
Anion gap: 14 (ref 5–15)
BUN: 16 mg/dL (ref 8–23)
CO2: 24 mmol/L (ref 22–32)
Calcium: 8.4 mg/dL — ABNORMAL LOW (ref 8.9–10.3)
Chloride: 95 mmol/L — ABNORMAL LOW (ref 98–111)
Creatinine, Ser: 0.72 mg/dL (ref 0.61–1.24)
GFR, Estimated: >60 mL/min (ref >60)
Glucose, Bld: 98 mg/dL (ref 70–99)
Potassium: 4.3 mmol/L (ref 3.5–5.1)
Sodium: 133 mmol/L — ABNORMAL LOW (ref 135–145)

## 2022-11-30 LAB — MAGNESIUM: Magnesium: 1.6 mg/dL — ABNORMAL LOW (ref 1.7–2.4)

## 2022-11-30 LAB — SODIUM
Sodium: 127 mmol/L — ABNORMAL LOW (ref 135–145)
Sodium: 132 mmol/L — ABNORMAL LOW (ref 135–145)

## 2022-11-30 LAB — LIPASE, BLOOD: Lipase: 27 U/L (ref 11–51)

## 2022-11-30 MED ORDER — IOHEXOL 300 MG/ML  SOLN
100.0000 mL | Freq: Once | INTRAMUSCULAR | Status: AC | PRN
  Administered 2022-11-30: 100 mL via INTRAVENOUS

## 2022-11-30 MED ORDER — IOHEXOL 9 MG/ML PO SOLN
500.0000 mL | ORAL | Status: AC
  Administered 2022-11-30 (×2): 500 mL via ORAL

## 2022-11-30 MED ORDER — MAGNESIUM SULFATE 2 GM/50ML IV SOLN
2.0000 g | Freq: Once | INTRAVENOUS | Status: AC
  Administered 2022-11-30: 2 g via INTRAVENOUS
  Filled 2022-11-30: qty 50

## 2022-11-30 NOTE — Progress Notes (Signed)
Progress Note   Patient: Arthur Patterson [id number] DOB: 08-17-1961 DOA: 11/20/2022     10 DOS: the patient was seen and examined on 11/30/2022   Brief hospital course: AVEREY MAROLD is a 62 y.o. male with medical history significant of hypertension, lumbar disc disease who presented to the ED on 11/20/2022 due to abdominal pain and generalized malaise and nausea since 11/16/2022.  The next day, he began to have multiple episodes of nonbilious, nonbloody vomit.  He states that he has had at least 5+ episodes of vomiting per day since then.  In addition, he has had diarrhea intermittently.  Mr. Oneill stated that he was taking meloxicam nearly every day for several months but his last dose was on 3/6. He has been still taking his home antihypertensives. He denies any daily alcohol use.   Admitted for acute renal failure with initial Cr of 7.8, Severe hyponatremia (initial sodium 113) and acute pancreatitis.   3/20 -- renal function has recovered.  Remains on 3% saline per nephrology with sodium slowly improving. 3/21 -- Na 129, stopped 3% saline, transfer out of ICU. Ongoing abdominal distention 3/22 -- Na 133>>132 this AM.  U/S and repeat CT for evaluation of distention.   Assessment and Plan: * Acute renal failure [facility]) Patient presenting with creatinine of 7.8, previously within normal limits.  Most likely secondary to significant hypovolemia in the setting of acute pancreatitis, and N/V/D.  Last BMP obtained 1 week ago prior to symptom onset with creatinine of 1.06.  - Nephrology consulted --Cr normalized with IV hydration  Abdominal distension Progressive abdominal distention, suspect due to pancreatitis and fluid retention from salt tabs. --U/S Ascites this AM - report pending --Will repeat CT abd/pelvis w/contrast given necrotizing pancreatitis seen on scan from 3/15  Acute pancreatitis Patient presenting with nausea and vomiting for 3 to 4 days in the setting of elevated lipase  and evidence of markedly inflamed pancreas on CT imaging.  Etiology uncertain at this time as patient declines any recent alcohol use and no gallstones or gallbladder thickening seen on CT imaging and no biliary dilation.  Triglycerides 199 on presentation.  Only medication he takes that is known to cause pancreatitis is simvastatin. --s/p aggressive IVF. --due to worsening leukocytosis, repeat CT a/p obtained on 3/15 which showed "Acute necrotizing pancreatitis with an approximately 3 cm area of non enhancement involving the pancreatic body."  Since no signs of infection currently, no need for ppx abx, agreed with curbside GI. Plan: --Repeat CT today, as below --If fever develops, then obtain blood cx and start meropenem. --repeat CT scan in 4 weeks  Hyponatremia Na 113 on presentation.  In the setting of profound dehydration and acute pancreatitis.  Despite renal recovery and continuous IVF since presentation, sodium remained around 120. 3/20: Na 120 >> 126 on 3% saline 3/21: Na 129, stopped 3% saline, cont salt tabs 3/22: Na 132 --Mgmt per Nephrology --Stop 3% saline this AM --Transfer to tele/med floor --Serial BMP's --On salt tablets  Hypocalcemia Likely secondary to pancreatitis.  No EKG changes or tetany on exam. --s/p IV cal gluconate x15 g.  Ca persistently around 7's Plan: --hold further IV Ca suppl --cont oral Ca suppl  Chronic back pain worsened with acute illness, now much better controlled --Norco PRN --cont home gabapentin (monitor for sedation) --Voltaren gel  Hypomagnesemia monitor and replete PRN with IV ma   Anxiety Continue home paroxetine and Xanax as needed  Essential hypertension --Hold home telmisartan-HCTZ in the setting  of AKI, started on oral hydralazine instead. --cont hydralazine 100 mg q8h  --IV hydralazine PRN  OSA (obstructive sleep apnea) - CPAP at bedtime        Subjective: Pt was seen awake resting in bed this AM.  He reports  feeling much better, except that his abdomen remains very distended.  He endorses passing gas and having BM's.  Denies any N/V.  He states noticing abdomen got distended at home, prior to admission after he was straining severely due to constipation.  Went to U/S this AM with results still pending at time of encounter.  He is agreeable to repeat CT scan given the pancreatitis.  He denies abdominal pain (only discomfort from distention) and states tolerating diet fine.   Physical Exam: Vitals:   11/29/22 1925 11/29/22 2200 11/30/22 0420 11/30/22 0857  BP: (!) 161/89 (!) 141/97 (!) 157/98 (!) 161/98  Pulse: (!) 109 (!) 102 (!) 106 (!) 108  Resp: 20  18 17   Temp: 99.2 F (37.3 C)  97.7 F (36.5 C) 98.2 F (36.8 C)  TempSrc: Oral  Oral   SpO2: 96%  98% 97%  Weight:      Height:       General exam: awake, alert, no acute distress HEENT: moist mucus membranes, hearing grossly normal  Respiratory system: on room air, normal respiratory effort. Cardiovascular system: normal S1/S2, RRR, nonpitting BLE edema.   Gastrointestinal system: tensely distended, non-tender, bowel sounds present Central nervous system: A&O x3. no gross focal neurologic deficits, normal speech Extremities: moves all, nonpitting edema of all extremities, normal tone Skin: dry, intact, normal temperature Psychiatry: normal mood, congruent affect, judgement and insight appear normal    Data Reviewed:  Notable labs ---  Na 129 >> 132 this AM.  Ca 8.4, WBC 15.6 >> 16.3.  Hbg stable 10.7>>10.3.   U/S ascites from this AM --- Pending  Repeat CT abd/pelvis --- Pending    Family Communication: None present. Will attempt to call this afternoon.  Disposition: Status is: Inpatient Remains inpatient appropriate because:  progressive abdominal distention with ongoing evaluation, persistent electrolyte derangements requiring close monitoring.    Planned Discharge Destination: Home    Time spent: 44  minutes  Author: Ezekiel Slocumb, DO 11/30/2022 12:09 PM  For on call review [email or web address].

## 2022-11-30 NOTE — Progress Notes (Signed)
[facility]  ROUNDING NOTE   Subjective:   Patient laying in bed Headed to Ultrasound Continues to have abd distension and firmness   Sodium 132  Objective:  Vital signs in last 24 hours:  Temp:  [97.7 F (36.5 C)-99.2 F (37.3 C)] 98.2 F (36.8 C) (03/22 0857) Pulse Rate:  [99-109] 108 (03/22 0857) Resp:  [17-21] 17 (03/22 0857) BP: (141-161)/(89-98) 161/98 (03/22 0857) SpO2:  [96 %-98 %] 97 % (03/22 0857) FiO2 (%):  [21 %] 21 % (03/21 1958)  Weight change:  Filed Weights   11/26/22 0500 11/26/22 1405 11/27/22 0500  Weight: 103.5 kg 103 kg 104.1 kg    Intake/Output: I/O last 3 completed shifts: In: 93 [P.O.:120; I.V.:426] Out: 575 [Urine:575]   Intake/Output this shift:  Total I/O In: 240 [P.O.:240] Out: -   Physical Exam: General: NAD  Head: Normocephalic, atraumatic. Moist oral mucosal membranes  Eyes: Anicteric  Lungs:  expiratory wheeze, normal effort  Heart: Regular rate and rhythm  Abdomen:  Firm, nontender, distended, tympanic  Extremities: Trace peripheral edema.  Neurologic: Alert and oriented, moving all four extremities  Skin: No lesions  Access: None    Basic Metabolic Panel: Recent Labs  Lab 11/26/22 0505 11/26/22 1032 11/27/22 0507 11/27/22 0714 11/28/22 0435 11/28/22 0950 11/29/22 0845 11/29/22 1229 11/29/22 1615 11/29/22 2022 11/30/22 0031 11/30/22 0316 11/30/22 0717  NA 119*   < > 120*   < > 126*   < > 130*   < > 129* 127* 127* 133* 132*  K 3.0*  --  3.4*  --  3.7  --  3.4*  --   --   --   --  4.3  --   CL 88*  --  91*  --  98  --  101  --   --   --   --  95*  --   CO2 22  --  22  --  23  --  21*  --   --   --   --  24  --   GLUCOSE 90  --  105*  --  111*  --  87  --   --   --   --  98  --   BUN 12  --  14  --  15  --  17  --   --   --   --  16  --   CREATININE 0.80  --  0.73  --  0.72  --  0.63  --   --   --   --  0.72  --   CALCIUM 7.2*  --  7.2*  --  7.5*  --  7.8*  --   --   --   --  8.4*  --   MG 1.6*   --  1.9  --  1.9  --  1.7  --   --   --   --  1.6*  --    < > = values in this interval not displayed.     Liver Function Tests: No results for input(s): "AST", "ALT", "ALKPHOS", "BILITOT", "PROT", "ALBUMIN" in the last 168 hours.  No results for input(s): "LIPASE", "AMYLASE" in the last 168 hours.  No results for input(s): "AMMONIA" in the last 168 hours.  CBC: Recent Labs  Lab 11/26/22 0505 11/27/22 0507 11/28/22 0435 11/29/22 0845 11/30/22 0316  WBC 21.2* 21.0* 20.2* 15.6* 16.3*  HGB 10.5* 10.7* 10.6* 10.7* 10.3*  HCT 29.6*  30.8* 30.9* 31.6* 30.7*  MCV 93.7 95.1 96.6 97.8 99.0  PLT 299 340 361 351 385     Cardiac Enzymes: No results for input(s): "CKTOTAL", "CKMB", "CKMBINDEX", "TROPONINI" in the last 168 hours.  BNP: Invalid input(s): "POCBNP"  CBG: No results for input(s): "GLUCAP" in the last 168 hours.   Microbiology: Results for orders placed or performed during the hospital encounter of 11/20/22  Resp panel by RT-PCR (RSV, Flu A&B, Covid) Anterior Nasal Swab     Status: None   Collection Time: 11/20/22 12:01 PM   Specimen: Anterior Nasal Swab  Result Value Ref Range Status   SARS Coronavirus 2 by RT PCR NEGATIVE NEGATIVE Final    Comment: (NOTE) SARS-CoV-2 target nucleic acids are NOT DETECTED.  The SARS-CoV-2 RNA is generally detectable in upper respiratory specimens during the acute phase of infection. The lowest concentration of SARS-CoV-2 viral copies this assay can detect is 138 copies/mL. A negative result does not preclude SARS-Cov-2 infection and should not be used as the sole basis for treatment or other patient management decisions. A negative result may occur with  improper specimen collection/handling, submission of specimen other than nasopharyngeal swab, presence of viral mutation(s) within the areas targeted by this assay, and inadequate number of viral copies(<138 copies/mL). A negative result must be combined with clinical  observations, patient history, and epidemiological information. The expected result is Negative.  Fact Sheet for Patients:  EntrepreneurPulse.com.au  Fact Sheet for Healthcare Providers:  IncredibleEmployment.be  This test is no t yet approved or cleared by the [place] FDA and  has been authorized for detection and/or diagnosis of SARS-CoV-2 by FDA under an Emergency Use Authorization (EUA). This EUA will remain  in effect (meaning this test can be used) for the duration of the COVID-19 declaration under Section 564(b)(1) of the Act, 21 U.S.C.section 360bbb-3(b)(1), unless the authorization is terminated  or revoked sooner.       Influenza A by PCR NEGATIVE NEGATIVE Final   Influenza B by PCR NEGATIVE NEGATIVE Final    Comment: (NOTE) The Xpert Xpress SARS-CoV-2/FLU/RSV plus assay is intended as an aid in the diagnosis of influenza from Nasopharyngeal swab specimens and should not be used as a sole basis for treatment. Nasal washings and aspirates are unacceptable for Xpert Xpress SARS-CoV-2/FLU/RSV testing.  Fact Sheet for Patients: EntrepreneurPulse.com.au  Fact Sheet for Healthcare Providers: IncredibleEmployment.be  This test is not yet approved or cleared by the [place] FDA and has been authorized for detection and/or diagnosis of SARS-CoV-2 by FDA under an Emergency Use Authorization (EUA). This EUA will remain in effect (meaning this test can be used) for the duration of the COVID-19 declaration under Section 564(b)(1) of the Act, 21 U.S.C. section 360bbb-3(b)(1), unless the authorization is terminated or revoked.     Resp Syncytial Virus by PCR NEGATIVE NEGATIVE Final    Comment: (NOTE) Fact Sheet for Patients: EntrepreneurPulse.com.au  Fact Sheet for Healthcare Providers: IncredibleEmployment.be  This test is not yet approved or cleared by  the [place] FDA and has been authorized for detection and/or diagnosis of SARS-CoV-2 by FDA under an Emergency Use Authorization (EUA). This EUA will remain in effect (meaning this test can be used) for the duration of the COVID-19 declaration under Section 564(b)(1) of the Act, 21 U.S.C. section 360bbb-3(b)(1), unless the authorization is terminated or revoked.  Performed at [facility], [address] [place], [place] [address]   MRSA Next Gen by PCR, Nasal     Status:  None   Collection Time: 11/26/22  2:03 PM   Specimen: Nasal Mucosa; Nasal Swab  Result Value Ref Range Status   MRSA by PCR Next Gen NOT DETECTED NOT DETECTED Final    Comment: (NOTE) The GeneXpert MRSA Assay (FDA approved for NASAL specimens only), is one component of a comprehensive MRSA colonization surveillance program. It is not intended to diagnose MRSA infection nor to guide or monitor treatment for MRSA infections. Test performance is not FDA approved in patients less than 31 years old. Performed at [facility], [address] [place], [place] [address]     Coagulation Studies: No results for input(s): "LABPROT", "INR" in the last 72 hours.  Urinalysis: No results for input(s): "COLORURINE", "LABSPEC", "PHURINE", "GLUCOSEU", "HGBUR", "BILIRUBINUR", "KETONESUR", "PROTEINUR", "UROBILINOGEN", "NITRITE", "LEUKOCYTESUR" in the last 72 hours.  Invalid input(s): "APPERANCEUR"     Imaging: [place] EKG SITE RITE  Result Date: 11/28/2022 If Site Rite image not attached, placement could not be confirmed due to current cardiac rhythm.    Medications:    magnesium sulfate bolus IVPB 2 g (11/30/22 1050)    calcium carbonate  1 tablet Oral TID WC   Chlorhexidine Gluconate Cloth  6 each Topical Daily   diclofenac Sodium  4 g Topical QID   enoxaparin (LOVENOX) injection  0.5 mg/kg Subcutaneous Q24H   gabapentin  300 mg Oral TID   hydrALAZINE  100 mg Oral Q8H   hydrocortisone  25 mg  Rectal BID   PARoxetine  30 mg Oral Daily   sodium chloride flush  3 mL Intravenous Q12H   sodium chloride  2 g Oral TID WC   acetaminophen **OR** acetaminophen, ALPRAZolam, fluticasone, hydrALAZINE, HYDROcodone-acetaminophen, ondansetron **OR** ondansetron (ZOFRAN) IV, polyethylene glycol  Assessment/ Plan:  Arthur Patterson is a 62 y.o.  male with past medical continues including hyperlipidemia, GERD, hypertension, lumbar disc disease, and diverticulosis, who was admitted to [facility] on 11/20/2022 for Hypocalcemia [E83.51] Nausea [R11.0] Acute renal failure ([place]) [N17.9] AKI (acute kidney injury) ([place]) [N17.9]    Acute kidney injury likely secondary to severe illness and hypovolemia.  Normal renal function noted on previous labs.  No IV contrast exposure.  Creatinine on ED arrival 7.82 with GFR 7.  Renal ultrasound negative for obstruction.   baseline  Lab Results  Component Value Date   CREATININE 0.72 11/30/2022   CREATININE 0.63 11/29/2022   CREATININE 0.72 11/28/2022    Intake/Output Summary (Last 24 hours) at 11/30/2022 1124 Last data filed at 11/30/2022 1119 Gross per 24 hour  Intake 366 ml  Output 450 ml  Net -84 ml    2.  Severe hyponatremia, sodium on admission 113.   3% hypertonic saline started on 11/26/2022 due to persistent hyponatremia. Sodium corrected to 132. Will continue salt tabs for now.  3.  Hypertension, essential.  Home regimen currently telmisartan-hydrochlorothiazide.  Blood pressure slightly elevated, primary team has resumed Hydralazine.    LOS: [place] 3/22/202411:24 AM

## 2022-11-30 NOTE — Progress Notes (Signed)
PT Cancellation Note  Patient Details Name: Arthur Patterson MRN: [id number] DOB: 08-17-1961   Cancelled Treatment:    Reason Eval/Treat Not Completed: Other (comment). Orders received and chart reviewed. Upon entry pt declining PT eval due to pending CT scan. Pt reports abdominal fullness and is trying to finish drinking CT scan prep liquid. Pt agreeable to PT services after imaging. PT to f/u with pt at a later time/date as medically appropriate.    Salem Caster. Fairly IV, PT, DPT Physical Therapist- [facility]  11/30/2022, 1:49 PM

## 2022-11-30 NOTE — Assessment & Plan Note (Signed)
Progressive abdominal distention, suspect due to pancreatitis and fluid retention from salt tabs. --U/S Ascites this AM - report pending --Will repeat CT abd/pelvis w/contrast given necrotizing pancreatitis seen on scan from 3/15

## 2022-12-01 DIAGNOSIS — K567 Ileus, unspecified: Secondary | ICD-10-CM | POA: Diagnosis not present

## 2022-12-01 DIAGNOSIS — R140 Abdominal distension (gaseous): Secondary | ICD-10-CM | POA: Diagnosis not present

## 2022-12-01 DIAGNOSIS — E871 Hypo-osmolality and hyponatremia: Secondary | ICD-10-CM | POA: Diagnosis not present

## 2022-12-01 DIAGNOSIS — K8591 Acute pancreatitis with uninfected necrosis, unspecified: Secondary | ICD-10-CM | POA: Diagnosis not present

## 2022-12-01 LAB — CBC
HCT: 29.6 % — ABNORMAL LOW (ref 39.0–52.0)
Hemoglobin: 10 g/dL — ABNORMAL LOW (ref 13.0–17.0)
MCH: 32.8 pg (ref 26.0–34.0)
MCHC: 33.8 g/dL (ref 30.0–36.0)
MCV: 97 fL (ref 80.0–100.0)
Platelets: 360 10*3/uL (ref 150–400)
RBC: 3.05 MIL/uL — ABNORMAL LOW (ref 4.22–5.81)
RDW: 14.7 % (ref 11.5–15.5)
WBC: 15.1 10*3/uL — ABNORMAL HIGH (ref 4.0–10.5)
nRBC: 0 % (ref 0.0–0.2)

## 2022-12-01 LAB — BASIC METABOLIC PANEL
Anion gap: 6 (ref 5–15)
BUN: 12 mg/dL (ref 8–23)
CO2: 25 mmol/L (ref 22–32)
Calcium: 7.6 mg/dL — ABNORMAL LOW (ref 8.9–10.3)
Chloride: 96 mmol/L — ABNORMAL LOW (ref 98–111)
Creatinine, Ser: 0.61 mg/dL (ref 0.61–1.24)
GFR, Estimated: >60 mL/min (ref >60)
Glucose, Bld: 96 mg/dL (ref 70–99)
Potassium: 3.3 mmol/L — ABNORMAL LOW (ref 3.5–5.1)
Sodium: 127 mmol/L — ABNORMAL LOW (ref 135–145)

## 2022-12-01 LAB — COMPREHENSIVE METABOLIC PANEL
ALT: 24 U/L (ref 0–44)
AST: 35 U/L (ref 15–41)
Albumin: 2 g/dL — ABNORMAL LOW (ref 3.5–5.0)
Alkaline Phosphatase: 103 U/L (ref 38–126)
Anion gap: 7 (ref 5–15)
BUN: 13 mg/dL (ref 8–23)
CO2: 24 mmol/L (ref 22–32)
Calcium: 7.7 mg/dL — ABNORMAL LOW (ref 8.9–10.3)
Chloride: 94 mmol/L — ABNORMAL LOW (ref 98–111)
Creatinine, Ser: 0.62 mg/dL (ref 0.61–1.24)
GFR, Estimated: >60 mL/min (ref >60)
Glucose, Bld: 98 mg/dL (ref 70–99)
Potassium: 3.8 mmol/L (ref 3.5–5.1)
Sodium: 125 mmol/L — ABNORMAL LOW (ref 135–145)
Total Bilirubin: 0.5 mg/dL (ref 0.3–1.2)
Total Protein: 5.5 g/dL — ABNORMAL LOW (ref 6.5–8.1)

## 2022-12-01 LAB — MAGNESIUM: Magnesium: 1.7 mg/dL (ref 1.7–2.4)

## 2022-12-01 MED ORDER — FUROSEMIDE 20 MG PO TABS: 20.0000 mg | ORAL_TABLET | Freq: Two times a day (BID) | ORAL | Status: DC

## 2022-12-01 MED ORDER — FUROSEMIDE 20 MG PO TABS
20.0000 mg | ORAL_TABLET | Freq: Two times a day (BID) | ORAL | Status: DC
  Administered 2022-12-01 (×2): 20 mg via ORAL
  Filled 2022-12-01 (×2): qty 1

## 2022-12-01 NOTE — Progress Notes (Signed)
Progress Note   Patient: Arthur Patterson [id number] DOB: 1961-08-23 DOA: 11/20/2022     11 DOS: the patient was seen and examined on 12/01/2022   Brief hospital course: Arthur Patterson is a 62 y.o. male with medical history significant of hypertension, lumbar disc disease who presented to the ED on 11/20/2022 due to abdominal pain and generalized malaise and nausea since 11/16/2022.  The next day, he began to have multiple episodes of nonbilious, nonbloody vomit.  He states that he has had at least 5+ episodes of vomiting per day since then.  In addition, he has had diarrhea intermittently.  Arthur Patterson stated that he was taking meloxicam nearly every day for several months but his last dose was on 3/6. He has been still taking his home antihypertensives. He denies any daily alcohol use.   Admitted for acute renal failure with initial Cr of 7.8, Severe hyponatremia (initial sodium 113) and acute pancreatitis.   3/20 -- renal function has recovered.  Remains on 3% saline per nephrology with sodium slowly improving. 3/21 -- Na 129, stopped 3% saline, transfer out of ICU. Ongoing abdominal distention 3/22 -- Na 133>>132 this AM.  U/S and repeat CT for evaluation of distention.   Assessment and Plan: * Acute renal failure [name]) Patient presenting with creatinine of 7.8, previously within normal limits.  Most likely secondary to significant hypovolemia in the setting of acute pancreatitis, and N/V/D.  Last BMP obtained 1 week ago prior to symptom onset with creatinine of 1.06.  - Nephrology consulted --Cr normalized with IV hydration  Abdominal distension Due to edema from pancreatitis and ileus. Progressive abdominal distention, suspect due to pancreatitis and fluid retention from salt tabs. 3/22 Imaging showed Ileus, increasing anasarca and increased pancreatic edema 3/23 Taken off salt tabs, started on diuretic.  Abdomen little softer. --Monitor closely  Acute pancreatitis Patient  presenting with nausea and vomiting for 3 to 4 days in the setting of elevated lipase and evidence of markedly inflamed pancreas on CT imaging.  Etiology uncertain at this time as patient declines any recent alcohol use and no gallstones or gallbladder thickening seen on CT imaging and no biliary dilation.  Triglycerides 199 on presentation.  Only medication he takes that is known to cause pancreatitis is simvastatin. --s/p aggressive IVF. --due to worsening leukocytosis, repeat CT a/p obtained on 3/15 which showed "Acute necrotizing pancreatitis with an approximately 3 cm area of non enhancement involving the pancreatic body."  Since no signs of infection currently, no need for ppx abx, agreed with curbside GI. Plan: --If fever develops, then obtain blood cx and start meropenem. --repeat CT scan in 4 weeks  Hyponatremia Na 113 on presentation.  In the setting of profound dehydration and acute pancreatitis.  Despite renal recovery and continuous IVF since presentation, sodium remained around 120. 3/20: Na 120 >> 126 on 3% saline 3/21: Na 129, stopped 3% saline, cont salt tabs 3/22: Na 132 3/23: Na down 125. Stopped salt tabs. Started on PO Lasix 20 mg BID per nephrology --Mgmt per Nephrology --Stop salt tabs --Started on Lasix 20 mg PO BID --Serial BMP's --Daily weights and I/O's to monitor volume status --On telmisartan-HCTZ at home, being held.  Would avoid thiazides.  Hypocalcemia Likely secondary to pancreatitis.  No EKG changes or tetany on exam. --s/p IV cal gluconate x15 g.  Ca persistently around 7's Plan: --hold further IV Ca suppl --cont oral Ca suppl  Ileus ([name]) Repeat CT abd/pelvis on 3/22, obtained for worsening distention  and minimal ascites seen on U/S -- showed moderately dilated small bowel loops without transition point, consistent with ileus. --Seems improving, having loose stools, no N/V --No need for NG tube at this time --Monitor closely for signs of obstruction  and will get surgery on board if needed --Stool softeners PRN  Chronic back pain worsened with acute illness, now much better controlled --Norco PRN --cont home gabapentin (monitor for sedation) --Voltaren gel  Hypomagnesemia monitor and replete PRN with IV ma   Anxiety Continue home paroxetine and Xanax as needed  Essential hypertension --Hold home telmisartan-HCTZ in the setting of AKI, started on oral hydralazine instead. --cont hydralazine 100 mg q8h  --IV hydralazine PRN --Likely should avoid HCTZ / thiazides with severe hyponatremia  OSA (obstructive sleep apnea) - CPAP at bedtime        Subjective: Pt was seen awake resting in bed this AM.  He reports abdomen being little less distended feeling today.  Continues to have diarrheal stools after constipation at home PTA.  He is understandably frustrated sodium is down this AM.  Discussed U/S and CT results from yesterday.  Given ileus, he denies any nausea/vomiting.  He otherwise reports feeling well, improved, getting stronger again, ambulating better.   Physical Exam: Vitals:   11/30/22 1938 12/01/22 0500 12/01/22 0510 12/01/22 0735  BP: (!) 158/88  (!) 146/86 (!) 141/87  Pulse: (!) 106  86 97  Resp: 18  18 16   Temp: 98.8 F (37.1 C)  98.5 F (36.9 C) 98.6 F (37 C)  TempSrc: Oral  Oral   SpO2: 99%  99% 96%  Weight:  104.1 kg    Height:       General exam: awake, alert, no acute distress HEENT: moist mucus membranes, hearing grossly normal  Respiratory system: on room air, normal respiratory effort. Cardiovascular system: normal S1/S2, RRR, nonpitting BLE edema mildly improved today.   Gastrointestinal system: abdomen is slightly softer and less distended, non-tender, bowel sounds present Central nervous system: A&O x3. no gross focal neurologic deficits, normal speech Extremities: moves all, nonpitting edema of all extremities is mildly improved, normal tone Skin: dry, intact, normal  temperature Psychiatry: normal mood, congruent affect, judgement and insight appear normal    Data Reviewed:  Notable labs ---  Na 129 >> 132 >> 125 this AM.  Ca 7.7, WBC 16.3 >> 15.1.  Hbg stable 10.3 >> 10.0.  Albumin low at 2.0, normal LFT's   3/22 U/S ascites from this AM --- minimal ascites seen  3/22 Repeat CT abd/pelvis --- pancreatic edema evolving, some fluid collections but no true pseudocysts.  Mild scattered ascites.  Increasing anasarca.  Ileus (moderately dilated small bowel loops, no transition point).  Small left pleural effusion.  Increasing small pericardial effusion.  Fatty liver.    Family Communication: None present. Will attempt to call this afternoon.  Disposition: Status is: Inpatient Remains inpatient appropriate because:  progressive abdominal distention with ongoing evaluation, persistent electrolyte derangements requiring close monitoring.    Planned Discharge Destination: Home    Time spent: 44 minutes  Author: Ezekiel Slocumb, DO 12/01/2022 12:16 PM  For on call review [email or web address].

## 2022-12-01 NOTE — Evaluation (Signed)
Physical Therapy Evaluation Patient Details Name: Arthur Patterson MRN: [id number] DOB: Jan 29, 1961 Today's Date: 12/01/2022  History of Present Illness  Pt admitted to [facility] on 11/20/22 for c/o abdominal pain, generalized malaise, and nausea since 3/8. Multiple episodes of non-bilious, non-bloody vomit prior to admission. Pt presents with hypocalcemia, acute renal failure, hyponatremia, and acute pancreatitis. Significant PMH includes: hyperlipidemia, GERD, hypertension, lumbar disc disease, and diverticulosis.   Clinical Impression  Pt is a 62 year old M admitted to hospital on 11/20/22 for acute pancreatitis and acute renal failure. At baseline, pt is independent with ADL's, light IADL's, ambulation without AD, driving, and working. Pt is limited with prolonged standing and walking secondary to cLBP.  Pt presents with decreased activity tolerance, decreased dynamic standing balance, and +2 pitting edema of bil hands/ankles, resulting in impaired functional mobility. Due to deficits, pt required mod I for bed mobility, SBA for transfers, and SBA for safety to ambulate 53ft x2 to stairwell for stair training. Pt able to navigate 2 steps without UE support and CGA for safety (per home set up), with mild imbalance performing reciprocal step pattern indicating he may require UUE support from family/wall for steadying with entry into home. Labored breathing noted during upright functional mobility with SpO2 at 92% on RA and HR elevated to 120's.  Deficits limit the pt's ability to safely and independently perform ADL's, transfer, and ambulate. Pt will benefit from acute skilled PT services to address deficits for return to baseline function. At this time, PT recommends DC home with referral to OPPT for deconditioning/balance. No DME needs identified at this time.  Pt appropriate for IND ambulation in room and ambulation in hallway with nursing staff for monitoring of vitals, and continued progress towards  goals while hospitalized. Pt, spouse, and RN verbalized understanding.        Recommendations for follow up therapy are one component of a multi-disciplinary discharge planning process, led by the attending physician.  Recommendations may be updated based on patient status, additional functional criteria and insurance authorization.  Follow Up Recommendations Outpatient PT      Assistance Recommended at Discharge PRN  Patient can return home with the following  Assistance with cooking/housework    Equipment Recommendations None recommended by PT     Functional Status Assessment Patient has had a recent decline in their functional status and demonstrates the ability to make significant improvements in function in a reasonable and predictable amount of time.     Precautions / Restrictions Restrictions Weight Bearing Restrictions: No      Mobility  Bed Mobility               General bed mobility comments: mod I to sit EOB, HOB elevated, use of BUE for support    Transfers                   General transfer comment: SBA for safety to perform STS from EOB without AD, increased time with initial standing for balance    Ambulation/Gait               General Gait Details: SBA for safety to ambulate 30ft x2 to stairwell and back without AD. Demonstrates slowed cadence, decreased step length/foot clearance bil, and increased lateral trunk deviation with weight shift. Labored breathing noted with SpO2 at 92% on RA. Elevated HR in 120's.  Stairs Stairs: Yes Stairs assistance: Min guard Stair Management: No rails   General stair comments: Able to navigate 2 steps with  reciprocal gait pattern and no UE support (As per home set up); completes 5 steps (descending) and 3 steps (ascending) with BUE support. CGA for safety due to labred breathing. Adequate foot clearance with steps.      Balance Overall balance assessment: Mild deficits observed, not formally  tested                                           Pertinent Vitals/Pain Pain Assessment Pain Assessment: 0-10 Pain Score: 4  Pain Location: cLBP Pain Descriptors / Indicators: Aching, Dull Pain Intervention(s): Monitored during session, Repositioned, Premedicated before session    [place] expects to be discharged to:: Private residence Living Arrangements: Spouse/significant other Available Help at Discharge: Available 24 hours/day;Family (spouse works from home and able to provide 24/7 care) Type of Home: House Home Access: Stairs to enter   [employer] of Steps: 2 STE without railing (garage entrance)   Home Layout: Two level;Full bath on main level;Able to live on main level with bedroom/bathroom Home Equipment: Shower seat - built in;Hand held shower head      Prior Function Prior Level of Function : Independent/Modified Independent;Working/employed;Driving             Mobility Comments: IND ambulation without AD, limited with standing/gait duration due to cLBP. 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 IND with meds.     Hand Dominance   Dominant Hand: Right    Extremity/Trunk Assessment   Upper Extremity Assessment Upper Extremity Assessment: Overall WFL for tasks assessed (grossly 5/5; sensation intact)    Lower Extremity Assessment Lower Extremity Assessment: Overall WFL for tasks assessed (grossly 5/5, sensation and coordination intact)    Cervical / Trunk Assessment Cervical / Trunk Assessment: Normal  Communication   Communication: No difficulties  Cognition Arousal/Alertness: Awake/alert Behavior During Therapy: WFL for tasks assessed/performed Overall Cognitive Status: Within Functional Limits for tasks assessed                                 General Comments: A&O x4;  able to follow 100% of 3-step commands        General Comments General comments (skin integrity, edema, etc.): +2 pitting edema in bil hands and bil ankles    Exercises Other Exercises Other Exercises: Participates in bed mobility, transfers, gait, and stair training without AD. Grossly mod I to SBA for safety. Elevated HR in 120's and SpO2 at 92%. Other Exercises: Pt and spouse educated re: PT role/POC, DC recommendations, DME use if needed, IND ambulation in room, ambulation in hallway with nursing for monitoring of vitals. They verbalized understanding.   Assessment/Plan    PT Assessment Patient needs continued PT services  PT Problem List Decreased activity tolerance;Decreased balance;Cardiopulmonary status limiting activity;Decreased skin integrity       PT Treatment Interventions Gait training;Stair training;Functional mobility training;Therapeutic activities;Therapeutic exercise;Balance training;Neuromuscular re-education    PT Goals (Current goals can be found in the Care Plan section)  Acute Rehab PT Goals Patient Stated Goal: "go home" PT Goal Formulation: With patient/family Time For Goal Achievement: 12/15/22 Potential to Achieve Goals: Good    Frequency Min 2X/week        AM-PAC PT "6 Clicks" Mobility  Outcome  Measure Help needed turning from your back to your side while in a flat bed without using bedrails?: None Help needed moving from lying on your back to sitting on the side of a flat bed without using bedrails?: None Help needed moving to and from a bed to a chair (including a wheelchair)?: A Little Help needed standing up from a chair using your arms (e.g., wheelchair or bedside chair)?: A Little Help needed to walk in hospital room?: A Little Help needed climbing 3-5 steps with a railing? : A Little 6 Click Score: 20    End of Session Equipment Utilized During Treatment: Gait belt Activity Tolerance: Patient tolerated treatment well Patient left: in  bed;with call bell/phone within reach;with family/visitor present Nurse Communication: Mobility status PT Visit Diagnosis: Unsteadiness on feet (R26.81)    [id number] PT Time Calculation (min) (ACUTE ONLY): 26 min   Charges:   PT Evaluation $PT Eval Low Complexity: 1 Low PT Treatments $Therapeutic Activity: 8-22 mins       Herminio Commons, PT, DPT 12:19 PM,12/01/22 Physical Therapist - [facility]

## 2022-12-01 NOTE — Assessment & Plan Note (Signed)
Repeat CT abd/pelvis on 3/22, obtained for worsening distention and minimal ascites seen on U/S -- showed moderately dilated small bowel loops without transition point, consistent with ileus. --Seems improving, having loose stools, no N/V --No need for NG tube at this time --Monitor closely for signs of obstruction and will get surgery on board if needed --Stool softeners PRN

## 2022-12-01 NOTE — Progress Notes (Signed)
[facility]  ROUNDING NOTE   Subjective:   Patient walking around in the hallway with physical therapy Well-appearing today.  States he feels great and is able to eat some. He also reports drinking large amounts of fluids during the day. He has developed significant edema over his legs and arms bilaterally.  Objective:  Vital signs in last 24 hours:  Temp:  [98.5 F (36.9 C)-99 F (37.2 C)] 98.6 F (37 C) (03/23 0735) Pulse Rate:  [86-106] 97 (03/23 0735) Resp:  [16-18] 16 (03/23 0735) BP: (137-158)/(86-95) 141/87 (03/23 0735) SpO2:  [96 %-99 %] 96 % (03/23 0735) Weight:  [104.1 kg] 104.1 kg (03/23 0500)  Weight change:  Filed Weights   11/26/22 1405 11/27/22 0500 12/01/22 0500  Weight: 103 kg 104.1 kg 104.1 kg    Intake/Output: I/O last 3 completed shifts: In: 78 [P.O.:840; I.V.:6] Out: 1250 [Urine:1250]   Intake/Output this shift:  Total I/O In: 3 [I.V.:3] Out: -   Physical Exam: General: NAD  Head: Normocephalic, atraumatic. Moist oral mucosal membranes  Eyes: Anicteric  Lungs:  Clear to auscultation, normal effort  Heart: Regular rate and rhythm  Abdomen:  Firm, nontender, distended, tympanic  Extremities: ++ peripheral edema.  Neurologic: Alert and oriented, moving all four extremities  Skin: No lesions    Basic Metabolic Panel: Recent Labs  Lab 11/27/22 0507 11/27/22 0714 11/28/22 0435 11/28/22 0950 11/29/22 0845 11/29/22 1229 11/29/22 2022 11/30/22 0031 11/30/22 0316 11/30/22 0717 12/01/22 0509  NA 120*   < > 126*   < > 130*   < > 127* 127* 133* 132* 125*  K 3.4*  --  3.7  --  3.4*  --   --   --  4.3  --  3.8  CL 91*  --  98  --  101  --   --   --  95*  --  94*  CO2 22  --  23  --  21*  --   --   --  24  --  24  GLUCOSE 105*  --  111*  --  87  --   --   --  98  --  98  BUN 14  --  15  --  17  --   --   --  16  --  13  CREATININE 0.73  --  0.72  --  0.63  --   --   --  0.72  --  0.62  CALCIUM 7.2*  --  7.5*  --  7.8*  --   --    --  8.4*  --  7.7*  MG 1.9  --  1.9  --  1.7  --   --   --  1.6*  --  1.7   < > = values in this interval not displayed.     Liver Function Tests: Recent Labs  Lab 12/01/22 0509  AST 35  ALT 24  ALKPHOS 103  BILITOT 0.5  PROT 5.5*  ALBUMIN 2.0*    Recent Labs  Lab 11/30/22 0316  LIPASE 27    No results for input(s): "AMMONIA" in the last 168 hours.  CBC: Recent Labs  Lab 11/27/22 0507 11/28/22 0435 11/29/22 0845 11/30/22 0316 12/01/22 0509  WBC 21.0* 20.2* 15.6* 16.3* 15.1*  HGB 10.7* 10.6* 10.7* 10.3* 10.0*  HCT 30.8* 30.9* 31.6* 30.7* 29.6*  MCV 95.1 96.6 97.8 99.0 97.0  PLT 340 361 351 385 360     Cardiac  Enzymes: No results for input(s): "CKTOTAL", "CKMB", "CKMBINDEX", "TROPONINI" in the last 168 hours.  BNP: Invalid input(s): "POCBNP"  CBG: No results for input(s): "GLUCAP" in the last 168 hours.   Microbiology: Results for orders placed or performed during the hospital encounter of 11/20/22  Resp panel by RT-PCR (RSV, Flu A&B, Covid) Anterior Nasal Swab     Status: None   Collection Time: 11/20/22 12:01 PM   Specimen: Anterior Nasal Swab  Result Value Ref Range Status   SARS Coronavirus 2 by RT PCR NEGATIVE NEGATIVE Final    Comment: (NOTE) SARS-CoV-2 target nucleic acids are NOT DETECTED.  The SARS-CoV-2 RNA is generally detectable in upper respiratory specimens during the acute phase of infection. The lowest concentration of SARS-CoV-2 viral copies this assay can detect is 138 copies/mL. A negative result does not preclude SARS-Cov-2 infection and should not be used as the sole basis for treatment or other patient management decisions. A negative result may occur with  improper specimen collection/handling, submission of specimen other than nasopharyngeal swab, presence of viral mutation(s) within the areas targeted by this assay, and inadequate number of viral copies(<138 copies/mL). A negative result must be combined with clinical  observations, patient history, and epidemiological information. The expected result is Negative.  Fact Sheet for Patients:  EntrepreneurPulse.com.au  Fact Sheet for Healthcare Providers:  IncredibleEmployment.be  This test is no t yet approved or cleared by the [place] FDA and  has been authorized for detection and/or diagnosis of SARS-CoV-2 by FDA under an Emergency Use Authorization (EUA). This EUA will remain  in effect (meaning this test can be used) for the duration of the COVID-19 declaration under Section 564(b)(1) of the Act, 21 U.S.C.section 360bbb-3(b)(1), unless the authorization is terminated  or revoked sooner.       Influenza A by PCR NEGATIVE NEGATIVE Final   Influenza B by PCR NEGATIVE NEGATIVE Final    Comment: (NOTE) The Xpert Xpress SARS-CoV-2/FLU/RSV plus assay is intended as an aid in the diagnosis of influenza from Nasopharyngeal swab specimens and should not be used as a sole basis for treatment. Nasal washings and aspirates are unacceptable for Xpert Xpress SARS-CoV-2/FLU/RSV testing.  Fact Sheet for Patients: EntrepreneurPulse.com.au  Fact Sheet for Healthcare Providers: IncredibleEmployment.be  This test is not yet approved or cleared by the [place] FDA and has been authorized for detection and/or diagnosis of SARS-CoV-2 by FDA under an Emergency Use Authorization (EUA). This EUA will remain in effect (meaning this test can be used) for the duration of the COVID-19 declaration under Section 564(b)(1) of the Act, 21 U.S.C. section 360bbb-3(b)(1), unless the authorization is terminated or revoked.     Resp Syncytial Virus by PCR NEGATIVE NEGATIVE Final    Comment: (NOTE) Fact Sheet for Patients: EntrepreneurPulse.com.au  Fact Sheet for Healthcare Providers: IncredibleEmployment.be  This test is not yet approved or cleared by  the [place] FDA and has been authorized for detection and/or diagnosis of SARS-CoV-2 by FDA under an Emergency Use Authorization (EUA). This EUA will remain in effect (meaning this test can be used) for the duration of the COVID-19 declaration under Section 564(b)(1) of the Act, 21 U.S.C. section 360bbb-3(b)(1), unless the authorization is terminated or revoked.  Performed at [facility], [address] [place],  [address]   MRSA Next Gen by PCR, Nasal     Status: None   Collection Time: 11/26/22  2:03 PM   Specimen: Nasal Mucosa; Nasal Swab  Result Value Ref Range Status  MRSA by PCR Next Gen NOT DETECTED NOT DETECTED Final    Comment: (NOTE) The GeneXpert MRSA Assay (FDA approved for NASAL specimens only), is one component of a comprehensive MRSA colonization surveillance program. It is not intended to diagnose MRSA infection nor to guide or monitor treatment for MRSA infections. Test performance is not FDA approved in patients less than 85 years old. Performed at [facility], [address] [place], [place] [address]     Coagulation Studies: No results for input(s): "LABPROT", "INR" in the last 72 hours.  Urinalysis: No results for input(s): "COLORURINE", "LABSPEC", "PHURINE", "GLUCOSEU", "HGBUR", "BILIRUBINUR", "KETONESUR", "PROTEINUR", "UROBILINOGEN", "NITRITE", "LEUKOCYTESUR" in the last 72 hours.  Invalid input(s): "APPERANCEUR"     Imaging: CT ABDOMEN PELVIS W CONTRAST  Result Date: 11/30/2022 CLINICAL DATA:  Pancreatitis. EXAM: CT ABDOMEN AND PELVIS WITH CONTRAST TECHNIQUE: Multidetector CT imaging of the abdomen and pelvis was performed using the standard protocol following bolus administration of intravenous contrast. RADIATION DOSE REDUCTION: This exam was performed according to the departmental dose-optimization program which includes automated exposure control, adjustment of the mA and/or kV according to patient size and/or  use of iterative reconstruction technique. CONTRAST:  131mL OMNIPAQUE IOHEXOL 300 MG/ML  SOLN COMPARISON:  CT 11/23/2022 FINDINGS: Lower chest: New tiny right pleural effusion. Stable small left. Adjacent atelectasis. Breathing motion. New small pericardial effusion. Hepatobiliary: Fatty liver infiltration. No space-occupying liver lesion. Gallbladder is present. Patent portal vein. Pancreas: Series decreased pancreatic edema enlargement. Along the neck of the pancreas is an area of heterogeneous enhancement, similar to previous. No gas. Previously there is confluence fluid and edema surrounding the pancreas extending of the mesentery which was severe. Distribution appears similar today but the areas are more confluence and have more defined margins. Some of the areas along the central mesentery are somewhat nodular in appearance and are increasing. Fluid tracks along the anterior perinephric space on the left and left pericolic gutter. No rim enhancing fluid collections at this time. Of note the splenic vein is patent but small. Spleen: Spleen is nonenlarged.  Preserved enhancement. Adrenals/Urinary Tract: Adrenal glands are preserved. Enhancing renal mass or collecting system dilatation. The ureters have normal course and caliber extending down to the bladder. Bladder is underdistended. Stomach/Bowel: Large bowel on this exam has a normal course and caliber. Few left-sided colonic diverticula. There are some air-filled distended loops along the transverse colon and fluid along the right side of the colon stomach has some luminal debris and oral contrast. Duodenal is nondilated. Of note there is fluid tracking into the porta hepatis and along the second portion of the duodenal in the pancreatic groove. Small-bowel has some loops that now measure 4.4 cm in diameter. This is new from previous. No abrupt transition. This could represent an ileus rather than a developing obstruction. Simple attention on follow-up.  Vascular/Lymphatic: Normal caliber aorta and IVC with some mild atherosclerotic plaque. No definite abnormal lymph node enlargement identified in the abdomen and pelvis. Reproductive: Prostate is unremarkable. Other: Anasarca is increasing. No free air. Trace free fluid in the pelvis and some edema in the presacral space, nonspecific. Surgical mesh along the anterior pelvic wall. Musculoskeletal: Curvature of the spine with some degenerative change. IMPRESSION: Evolving pancreatic edema with some residual asymmetric areas along the pancreatic neck. Recommend continued follow-up to assess for evolution of pancreatic necrosis. Increasing overall mesenteric stranding and fluid. The areas of fluid are now becoming more confluence with defined margins consistent with the evolution of pancreatitis. Acute peripancreatic fluid  collections. Recommend continued surveillance. No true pseudocyst formation at this time. No soft tissue gas. Mild scattered ascites. Developing of moderately dilated loops of small bowel in the midabdomen but no abrupt transition. Favor ileus than obstruction. Recommend attention on follow-up. Persistent small left effusion.  New tiny right. Increasing small pericardial effusion. Fatty liver infiltration. Patent portal and splenic veins. The splenic vein is small in caliber. Electronically Signed   By: Jill Side M.D.   On: 11/30/2022 16:38   [place] ASCITES (ABDOMEN LIMITED)  Result Date: 11/30/2022 CLINICAL DATA:  Abdominal distension. EXAM: LIMITED ABDOMEN ULTRASOUND FOR ASCITES TECHNIQUE: Limited ultrasound survey for ascites was performed in all four abdominal quadrants. COMPARISON:  Jan 23, 2023. FINDINGS: Minimal ascites is noted in the right lower quadrant. No significant fluid is noted in the other quadrants. IMPRESSION: Minimal ascites is noted in right lower quadrant. Electronically Signed   By: Marijo Conception M.D.   On: 11/30/2022 12:13     Medications:      calcium carbonate  1  tablet Oral TID WC   Chlorhexidine Gluconate Cloth  6 each Topical Daily   diclofenac Sodium  4 g Topical QID   enoxaparin (LOVENOX) injection  0.5 mg/kg Subcutaneous Q24H   furosemide  20 mg Oral BID   gabapentin  300 mg Oral TID   hydrALAZINE  100 mg Oral Q8H   hydrocortisone  25 mg Rectal BID   PARoxetine  30 mg Oral Daily   sodium chloride flush  3 mL Intravenous Q12H   acetaminophen **OR** acetaminophen, ALPRAZolam, fluticasone, hydrALAZINE, HYDROcodone-acetaminophen, ondansetron **OR** ondansetron (ZOFRAN) IV, polyethylene glycol  Assessment/ Plan:  Mr. Arthur Patterson is a 62 y.o.  male with past medical continues including hyperlipidemia, GERD, hypertension, lumbar disc disease, and diverticulosis, who was admitted to [facility] on 11/20/2022 for Hypocalcemia [E83.51] Nausea [R11.0] Acute renal failure ([place]) [N17.9] AKI (acute kidney injury) ([place]) [N17.9]    Acute kidney injury likely secondary to severe illness and hypovolemia.  Normal renal function noted on previous labs.  No IV contrast exposure.  Creatinine on ED arrival 7.82 with GFR 7.  Renal ultrasound negative for obstruction.   Creatinine is now back to baseline.  Lab Results  Component Value Date   CREATININE 0.62 12/01/2022   CREATININE 0.72 11/30/2022   CREATININE 0.63 11/29/2022    Intake/Output Summary (Last 24 hours) at 12/01/2022 1323 Last data filed at 12/01/2022 0906 Gross per 24 hour  Intake 483 ml  Output 600 ml  Net -117 ml    2.  Severe hyponatremia, sodium on admission 113.   3% hypertonic saline started on 11/26/2022 due to persistent hyponatremia. Sodium corrected to 132.  Recommended to patient to follow fluid restriction of about 1200 cc/day.  He is now hypervolemic with significant generalized edema.  Will start a small dose of furosemide and observe response.repeat BMP at 3 pm today.   3.  Hypertension, essential.  Home regimen currently telmisartan-hydrochlorothiazide.  Blood pressure slightly  elevated, primary team has resumed Hydralazine.  4.  Acute pancreatitis Symptomatically improving Serial abdominal imaging for monitoring.    LOS: [place] 3/23/20241:23 PM

## 2022-12-02 DIAGNOSIS — K8591 Acute pancreatitis with uninfected necrosis, unspecified: Secondary | ICD-10-CM | POA: Diagnosis not present

## 2022-12-02 DIAGNOSIS — E871 Hypo-osmolality and hyponatremia: Secondary | ICD-10-CM | POA: Diagnosis not present

## 2022-12-02 DIAGNOSIS — K567 Ileus, unspecified: Secondary | ICD-10-CM | POA: Diagnosis not present

## 2022-12-02 LAB — BASIC METABOLIC PANEL
Anion gap: 4 — ABNORMAL LOW (ref 5–15)
BUN: 9 mg/dL (ref 8–23)
CO2: 24 mmol/L (ref 22–32)
Calcium: 7.6 mg/dL — ABNORMAL LOW (ref 8.9–10.3)
Chloride: 97 mmol/L — ABNORMAL LOW (ref 98–111)
Creatinine, Ser: 0.66 mg/dL (ref 0.61–1.24)
GFR, Estimated: >60 mL/min (ref >60)
Glucose, Bld: 88 mg/dL (ref 70–99)
Potassium: 3.3 mmol/L — ABNORMAL LOW (ref 3.5–5.1)
Sodium: 125 mmol/L — ABNORMAL LOW (ref 135–145)

## 2022-12-02 LAB — HEPATIC FUNCTION PANEL
ALT: 19 U/L (ref 0–44)
AST: 29 U/L (ref 15–41)
Albumin: 2.1 g/dL — ABNORMAL LOW (ref 3.5–5.0)
Alkaline Phosphatase: 100 U/L (ref 38–126)
Bilirubin, Direct: 0.1 mg/dL (ref 0.0–0.2)
Indirect Bilirubin: 0.4 mg/dL (ref 0.3–0.9)
Total Bilirubin: 0.5 mg/dL (ref 0.3–1.2)
Total Protein: 5.8 g/dL — ABNORMAL LOW (ref 6.5–8.1)

## 2022-12-02 LAB — CBC
HCT: 30.5 % — ABNORMAL LOW (ref 39.0–52.0)
Hemoglobin: 10.1 g/dL — ABNORMAL LOW (ref 13.0–17.0)
MCH: 32.3 pg (ref 26.0–34.0)
MCHC: 33.1 g/dL (ref 30.0–36.0)
MCV: 97.4 fL (ref 80.0–100.0)
Platelets: 346 10*3/uL (ref 150–400)
RBC: 3.13 MIL/uL — ABNORMAL LOW (ref 4.22–5.81)
RDW: 14.7 % (ref 11.5–15.5)
WBC: 16.2 10*3/uL — ABNORMAL HIGH (ref 4.0–10.5)
nRBC: 0 % (ref 0.0–0.2)

## 2022-12-02 LAB — PHOSPHORUS: Phosphorus: 3.9 mg/dL (ref 2.5–4.6)

## 2022-12-02 LAB — PROCALCITONIN: Procalcitonin: 0.26 ng/mL

## 2022-12-02 LAB — MAGNESIUM: Magnesium: 1.8 mg/dL (ref 1.7–2.4)

## 2022-12-02 LAB — SODIUM: Sodium: 127 mmol/L — ABNORMAL LOW (ref 135–145)

## 2022-12-02 MED ORDER — POTASSIUM CHLORIDE CRYS ER 20 MEQ PO TBCR
40.0000 meq | EXTENDED_RELEASE_TABLET | Freq: Once | ORAL | Status: AC
  Administered 2022-12-02: 40 meq via ORAL
  Filled 2022-12-02: qty 2

## 2022-12-02 MED ORDER — TELMISARTAN 80 MG PO TABS: 80.0000 mg | ORAL_TABLET | Freq: Every day | ORAL | 1 refills | Status: AC

## 2022-12-02 MED ORDER — TOLVAPTAN 15 MG PO TABS
15.0000 mg | ORAL_TABLET | Freq: Once | ORAL | Status: AC
  Administered 2022-12-02: 15 mg via ORAL
  Filled 2022-12-02: qty 1

## 2022-12-02 MED ORDER — CALCIUM CARBONATE 1250 (500 CA) MG PO TABS: 1.0000 | ORAL_TABLET | Freq: Three times a day (TID) | ORAL | 0 refills | Status: AC

## 2022-12-02 MED ORDER — DICLOFENAC SODIUM 1 % EX GEL: 4.0000 g | Freq: Four times a day (QID) | CUTANEOUS | Status: AC

## 2022-12-02 MED ORDER — POTASSIUM CHLORIDE 10 MEQ/100ML IV SOLN: 10.0000 meq | INTRAVENOUS | Status: DC

## 2022-12-02 MED ORDER — HYDRALAZINE HCL 100 MG PO TABS: 100.0000 mg | ORAL_TABLET | Freq: Three times a day (TID) | ORAL | 1 refills | Status: AC

## 2022-12-02 MED ORDER — HYDROCORTISONE ACETATE 25 MG RE SUPP: 25.0000 mg | Freq: Two times a day (BID) | RECTAL | 0 refills | Status: AC

## 2022-12-02 MED ORDER — IRBESARTAN 150 MG PO TABS
75.0000 mg | ORAL_TABLET | Freq: Every day | ORAL | Status: DC
  Administered 2022-12-02: 75 mg via ORAL
  Filled 2022-12-02: qty 1

## 2022-12-02 NOTE — Consult Note (Signed)
[place] NOTE  Pharmacy Consult for Tolvaptan Indication: Severe hyponatremia  Intake/Output from previous day: 03/23 0701 - 03/24 0700 In: [id number] [P.O.:1040; I.V.:9] Out: 680 [Urine:680] Intake/Output from this shift: No intake/output data recorded.  Labs: Recent Labs    11/30/22 0316 12/01/22 0509 12/01/22 1501 12/02/22 0602  WBC 16.3* 15.1*  --  16.2*  HGB 10.3* 10.0*  --  10.1*  HCT 30.7* 29.6*  --  30.5*  PLT 385 360  --  346  CREATININE 0.72 0.62 0.61 0.66  MG 1.6* 1.7  --  1.8  PHOS  --   --   --  3.9  ALBUMIN  --  2.0*  --   --   PROT  --  5.5*  --   --   AST  --  35  --   --   ALT  --  24  --   --   ALKPHOS  --  103  --   --   BILITOT  --  0.5  --   --    Estimated Creatinine Clearance: 112.4 mL/min (by C-G formula based on SCr of 0.66 mg/dL).  Medical History: Past Medical History:  Diagnosis Date   Anxiety    Family history of adverse reaction to anesthesia    FAther had trouble waking   Hypertension    Sleep apnea    Spondylolisthesis at L4-L5 level     Medications:  NaCl tablets 1 g TIDM (3/16 - 3/20) 3% NaCl at 35 cc/hr (3/18 - 3/21) NaCl tablets 2 g TIDM (3/20 - 3/23) Furosemide 20 mg BID (3/23 - 3/23) Tolvaptan 15 mg PO x 1 on 3/23  Assessment: Patient is a 62 y/o M with medical history including HTN, HLD, lumbar disc disease, GERD, diverticulosis admitted 11/20/2022 with acute renal failure, pancreatitis, hyponatremia. Nephrology following. Pharmacy consulted to assist with tolvaptan monitoring.  Goal of Therapy:  Increase Na by 4-6 mmol/L in 24h (maximum 8 mmol/L)  Plan:  --Na 125 today (goal increase to 129 to 131) --Tolvaptan 15 mg PO x 1 --Na checks q8h --Fluid restriction 1.2L (consider liberalizing / adding free water if sodium overcorrects)  Benita Gutter 12/02/2022,9:15 AM

## 2022-12-02 NOTE — TOC Progression Note (Signed)
Transition of Care [facility]) - Progression Note    Patient Details  Name: Arthur Patterson MRN: [id number] Date of Birth: 02/05/1961  Transition of Care [facility]) CM/SW Contact  Valente David, RN Phone Number: 12/02/2022, 4:18 PM  Clinical Narrative:     Spoke with patient regarding recommendations for outpatient physical therapy.  State he was active with [facility] clinic for therapy prior to admission, however it was causing more back pain and he stopped.  He prefers to wait until he has had follow up with provider post discharge then he will have them provide recommendations for therapy in the future.       Expected Discharge Plan and Services                                               Social Determinants of Health (SDOH) Interventions SDOH Screenings   Food Insecurity: No Food Insecurity (11/22/2022)  Housing: Low Risk  (11/22/2022)  Transportation Needs: No Transportation Needs (11/22/2022)  Utilities: Not At Risk (11/22/2022)  Tobacco Use: Low Risk  (11/23/2022)    Readmission Risk Interventions     No data to display

## 2022-12-02 NOTE — Discharge Summary (Signed)
Physician Discharge Summary   Patient: Arthur Patterson MRN: [id number] DOB: Oct 29, 1960  Admit date:     11/20/2022  Discharge date: 12/03/22  Discharge Physician: Arthur Patterson   PCP: Arthur Aus, MD   Recommendations at discharge:    Follow up with Nephrology this week Follow up with Primary Care in 1-2 weeks Repeat BMP by mid-week Follow up on BP control.  HCTZ was stopped due to severe hyponatremia.  Hydralazine was started, telmisartan continued. Repeat CT abdomen / pelvis in 4 weeks to reassess pancreatitis  Discharge Diagnoses: Active Problems:   Acute pancreatitis   Abdominal distension   Hyponatremia   OSA (obstructive sleep apnea)   Essential hypertension   Anxiety   Hypomagnesemia   Chronic back pain  Principal Problem (Resolved):   Acute renal failure ([place]) Resolved Problems:   Hypocalcemia   Ileus Arthur Patterson)  Hospital Course: Arthur Patterson is a 62 y.o. male with medical history significant of hypertension, lumbar disc disease who presented to the ED on 11/20/2022 due to abdominal pain and generalized malaise and nausea since 11/16/2022.  The next day, he began to have multiple episodes of nonbilious, nonbloody vomit.  He states that he has had at least 5+ episodes of vomiting per day since then.  In addition, he has had diarrhea intermittently.  Arthur Patterson stated that he was taking meloxicam nearly every day for several months but his last dose was on 3/6. He has been still taking his home antihypertensives. He denies any daily alcohol use.   Admitted for acute renal failure with initial Cr of 7.8, Severe hyponatremia (initial sodium 113) and acute pancreatitis.   3/20 -- renal function has recovered.  Remains on 3% saline per nephrology with sodium slowly improving. 3/21 -- Na 129, stopped 3% saline, transfer out of ICU. Ongoing abdominal distention 3/22 -- Na 133>>132 this AM.  U/S and repeat CT for evaluation of distention.  Assessment and Plan: * Acute  renal failure (HCC)-resolved as of 12/03/2022 Patient presenting with creatinine of 7.8, previously within normal limits.  Most likely secondary to significant hypovolemia in the setting of acute pancreatitis, and N/V/D.  Last BMP obtained 1 week ago prior to symptom onset with creatinine of 1.06.  - Nephrology consulted --Cr normalized with IV hydration  Abdominal distension IMPROVING.  Appears mostly due to edema from pancreatitis and ileus. Progressive abdominal distention, suspect due to pancreatitis and fluid retention from salt tabs. 3/22 Imaging showed Ileus, increasing anasarca and increased pancreatic edema 3/23 Taken off salt tabs, started on diuretic.  Abdomen little softer. --Monitor closely  Acute pancreatitis Patient presenting with nausea and vomiting for 3 to 4 days in the setting of elevated lipase and evidence of markedly inflamed pancreas on CT imaging.  Etiology uncertain at this time as patient declines any recent alcohol use and no gallstones or gallbladder thickening seen on CT imaging and no biliary dilation.  Triglycerides 199 on presentation.  Only medication he takes that is known to cause pancreatitis is simvastatin. --s/p aggressive IVF. --due to worsening leukocytosis, repeat CT a/p obtained on 3/15 which showed "Acute necrotizing pancreatitis with an approximately 3 cm area of non enhancement involving the pancreatic body."  Since no signs of infection currently, no need for ppx abx, agreed with curbside GI. Tolerating diet and asymptomatic. --repeat CT scan in 4 weeks --consider GI referral as outpatient if needed  Hyponatremia Na 113 on presentation.  In the setting of profound dehydration and acute pancreatitis.  Despite  renal recovery and continuous IVF since presentation, sodium remained around 120. 3/20: Na 120 >> 126 on 3% saline 3/21: Na 129, stopped 3% saline, cont salt tabs 3/22: Na 132 3/23: Na down 125. Stopped salt tabs. Started on PO Lasix 20 mg  BID per nephrology 3/24: Na 125 >>127.  Given tolvaptan --Mgmt per Nephrology - agreeable with d/c and very close outpatient follow up --Close Nephrology follow --Off salt tabs due to progressive hypervolemia --Started on Lasix 20 mg PO BID --BMP in follow up by mid-week --Daily weights  to monitor volume status --Stopped HCTZ  Hypocalcemia-resolved as of 12/03/2022 Likely secondary to pancreatitis.  No EKG changes or tetany on exam. --s/p IV cal gluconate x15 g.  Ca persistently around 7's Plan: --hold further IV Ca suppl --cont oral Ca suppl  Chronic back pain worsened with acute illness, now much better controlled.  Lumbar surgery was planned but cancelled due to hyponatremia on pre-op labs --Norco PRN --cont home gabapentin (monitor for sedation) --Voltaren gel  Hypomagnesemia monitor and replete PRN with IV ma   Anxiety Continue home paroxetine and Xanax as needed  Essential hypertension --Hold home telmisartan-HCTZ in the setting of AKI, started on oral hydralazine instead. --cont hydralazine 100 mg q8h  --resume telmisartan at discharge --Stop HCTZ / thiazides with severe hyponatremia --PCP follow up --Advise home BP monitoring until follow up  OSA (obstructive sleep apnea) - CPAP at bedtime  Ileus (HCC)-resolved as of 12/03/2022 Repeat CT abd/pelvis on 3/22, obtained for worsening distention and minimal ascites seen on U/S -- showed moderately dilated small bowel loops without transition point, consistent with ileus. --Seems improving, having loose stools, no N/V --No need for NG tube at this time --Monitor closely for signs of obstruction and will get surgery on board if needed --Stool softeners PRN         Consultants: Nephrology,  Procedures performed: None  Disposition: Home Diet recommendation:  Discharge Diet Orders (From admission, onward)     Start     Ordered   12/02/22 0000  Diet - low sodium heart healthy        12/02/22 1831            Cardiac diet DISCHARGE MEDICATION: Allergies as of 12/02/2022   No Known Allergies      Medication List     STOP taking these medications    telmisartan-hydrochlorothiazide 80-12.5 MG tablet Commonly known as: MICARDIS HCT       TAKE these medications    ALPRAZolam 0.25 MG tablet Commonly known as: XANAX Take 0.25 mg by mouth daily as needed for anxiety.   calcium carbonate 1250 (500 Ca) MG tablet Commonly known as: OS-CAL - dosed in mg of elemental calcium Take 1 tablet (1,250 mg total) by mouth 3 (three) times daily with meals.   diclofenac Sodium 1 % Gel Commonly known as: VOLTAREN Apply 4 g topically 4 (four) times daily.   fluticasone 50 MCG/ACT nasal spray Commonly known as: FLONASE Place 2 sprays into both nostrils daily.   gabapentin 100 MG capsule Commonly known as: NEURONTIN Take 100-300 mg by mouth daily as needed (pain).   hydrALAZINE 100 MG tablet Commonly known as: APRESOLINE Take 1 tablet (100 mg total) by mouth every 8 (eight) hours.   hydrocortisone 25 MG suppository Commonly known as: ANUSOL-HC Place 1 suppository (25 mg total) rectally 2 (two) times daily.   PARoxetine 30 MG tablet Commonly known as: PAXIL Take 30 mg by mouth daily.   simvastatin 40  MG tablet Commonly known as: ZOCOR Take 1 tablet by mouth at bedtime.   telmisartan 80 MG tablet Commonly known as: MICARDIS Take 1 tablet (80 mg total) by mouth daily.        Follow-up Information     Arthur Aus, MD. Call.   Specialty: Internal Medicine Why: Follow up in 1-2 weeks. Contact information: [place] [address] [phone]         Murlean Iba, MD. Call today.   Specialty: Nephrology Why: Please follow up for BP and sodium level check this week Contact information: [place] [address] [phone]         Lavonia Dana, MD .   Specialty:  Nephrology Contact information: [address] [place] [address] [phone]                Discharge Exam: Filed Weights   11/26/22 1405 11/27/22 0500 12/01/22 0500  Weight: 103 kg 104.1 kg 104.1 kg   General exam: awake, alert, no acute distress HEENT: atraumatic, clear conjunctiva, anicteric sclera, moist mucus membranes, hearing grossly normal  Respiratory system: CTAB, no wheezes, rales or rhonchi, normal respiratory effort. Cardiovascular system: normal S1/S2, RRR, no JVD, murmurs, rubs, gallops, improved edema on all extremities.   Gastrointestinal system: less distended today, non-tender, +bowel sounds. Central nervous system: A&O x4. no gross focal neurologic deficits, normal speech Extremities: moves all, improved edema, normal tone Skin: dry, intact, normal temperature Psychiatry: normal mood, congruent affect, judgement and insight appear normal   Condition at discharge: stable  The results of significant diagnostics from this hospitalization (including imaging, microbiology, ancillary and laboratory) are listed below for reference.   Imaging Studies: CT ABDOMEN PELVIS W CONTRAST  Result Date: 11/30/2022 CLINICAL DATA:  Pancreatitis. EXAM: CT ABDOMEN AND PELVIS WITH CONTRAST TECHNIQUE: Multidetector CT imaging of the abdomen and pelvis was performed using the standard protocol following bolus administration of intravenous contrast. RADIATION DOSE REDUCTION: This exam was performed according to the departmental dose-optimization program which includes automated exposure control, adjustment of the mA and/or kV according to patient size and/or use of iterative reconstruction technique. CONTRAST:  156mL OMNIPAQUE IOHEXOL 300 MG/ML  SOLN COMPARISON:  CT 11/23/2022 FINDINGS: Lower chest: New tiny right pleural effusion. Stable small left. Adjacent atelectasis. Breathing motion. New small pericardial effusion. Hepatobiliary: Fatty liver infiltration.  No space-occupying liver lesion. Gallbladder is present. Patent portal vein. Pancreas: Series decreased pancreatic edema enlargement. Along the neck of the pancreas is an area of heterogeneous enhancement, similar to previous. No gas. Previously there is confluence fluid and edema surrounding the pancreas extending of the mesentery which was severe. Distribution appears similar today but the areas are more confluence and have more defined margins. Some of the areas along the central mesentery are somewhat nodular in appearance and are increasing. Fluid tracks along the anterior perinephric space on the left and left pericolic gutter. No rim enhancing fluid collections at this time. Of note the splenic vein is patent but small. Spleen: Spleen is nonenlarged.  Preserved enhancement. Adrenals/Urinary Tract: Adrenal glands are preserved. Enhancing renal mass or collecting system dilatation. The ureters have normal course and caliber extending down to the bladder. Bladder is underdistended. Stomach/Bowel: Large bowel on this exam has a normal course and caliber. Few left-sided colonic diverticula. There are some air-filled distended loops along the transverse colon and fluid along the right side of the colon stomach has some luminal debris  and oral contrast. Duodenal is nondilated. Of note there is fluid tracking into the porta hepatis and along the second portion of the duodenal in the pancreatic groove. Small-bowel has some loops that now measure 4.4 cm in diameter. This is new from previous. No abrupt transition. This could represent an ileus rather than a developing obstruction. Simple attention on follow-up. Vascular/Lymphatic: Normal caliber aorta and IVC with some mild atherosclerotic plaque. No definite abnormal lymph node enlargement identified in the abdomen and pelvis. Reproductive: Prostate is unremarkable. Other: Anasarca is increasing. No free air. Trace free fluid in the pelvis and some edema in the  presacral space, nonspecific. Surgical mesh along the anterior pelvic wall. Musculoskeletal: Curvature of the spine with some degenerative change. IMPRESSION: Evolving pancreatic edema with some residual asymmetric areas along the pancreatic neck. Recommend continued follow-up to assess for evolution of pancreatic necrosis. Increasing overall mesenteric stranding and fluid. The areas of fluid are now becoming more confluence with defined margins consistent with the evolution of pancreatitis. Acute peripancreatic fluid collections. Recommend continued surveillance. No true pseudocyst formation at this time. No soft tissue gas. Mild scattered ascites. Developing of moderately dilated loops of small bowel in the midabdomen but no abrupt transition. Favor ileus than obstruction. Recommend attention on follow-up. Persistent small left effusion.  New tiny right. Increasing small pericardial effusion. Fatty liver infiltration. Patent portal and splenic veins. The splenic vein is small in caliber. Electronically Signed   By: Jill Side M.D.   On: 11/30/2022 16:38   [place] ASCITES (ABDOMEN LIMITED)  Result Date: 11/30/2022 CLINICAL DATA:  Abdominal distension. EXAM: LIMITED ABDOMEN ULTRASOUND FOR ASCITES TECHNIQUE: Limited ultrasound survey for ascites was performed in all four abdominal quadrants. COMPARISON:  Jan 23, 2023. FINDINGS: Minimal ascites is noted in the right lower quadrant. No significant fluid is noted in the other quadrants. IMPRESSION: Minimal ascites is noted in right lower quadrant. Electronically Signed   By: Marijo Conception M.D.   On: 11/30/2022 12:13   [place] EKG SITE RITE  Result Date: 11/28/2022 If Site Rite image not attached, placement could not be confirmed due to current cardiac rhythm.  CT ABDOMEN PELVIS W CONTRAST  Result Date: 11/23/2022 CLINICAL DATA:  Pancreatitis with worsening leukocytosis. EXAM: CT ABDOMEN AND PELVIS WITH CONTRAST TECHNIQUE: Multidetector CT imaging of the abdomen  and pelvis was performed using the standard protocol following bolus administration of intravenous contrast. RADIATION DOSE REDUCTION: This exam was performed according to the departmental dose-optimization program which includes automated exposure control, adjustment of the mA and/or kV according to patient size and/or use of iterative reconstruction technique. CONTRAST:  126mL OMNIPAQUE IOHEXOL 300 MG/ML  SOLN COMPARISON:  CT abdomen pelvis dated 11/20/2022. FINDINGS: Lower chest: There is a moderate left pleural effusion with associated atelectasis. Hepatobiliary: The liver is diffusely hypoattenuating, consistent with hepatic steatosis no focal liver abnormality is seen. No gallstones, gallbladder wall thickening, or biliary dilatation. Pancreas: Significant peripancreatic inflammatory changes appear mildly increased compared to 11/20/2022. There is an approximately 3 cm area of apparent non enhancement involving the pancreatic body (series 2, images 20 9-30 and series 6 images 47-49). These findings are consistent with acute necrotizing pancreatitis. The prior exam was a noncontrast exam and therefore it is unclear if there was an area of non enhancement on 11/20/2022. No definite evidence of acute necrotic collections. No soft tissue gas seen in the pancreatic bed suggest infected necrosis. Spleen: Normal in size without focal abnormality. Adrenals/Urinary Tract: Adrenal glands are unremarkable. Kidneys are normal,  without renal calculi, focal lesion, or hydronephrosis. Bladder is unremarkable. Stomach/Bowel: Stomach is within normal limits. Enteric contrast reaches the distal ileum. The appendix is not definitely identified, however no pericecal inflammatory changes are noted to suggest acute appendicitis. There is colonic diverticulosis without evidence of diverticulitis. No evidence of bowel wall thickening, distention, or inflammatory changes. Vascular/Lymphatic: No significant vascular findings are  present. No evidence of splenic vein thrombosis or splenic artery pseudoaneurysm. No enlarged abdominal or pelvic lymph nodes. Reproductive: Prostate is unremarkable. Other: No abdominal wall hernia or abnormality. No abdominopelvic ascites. Musculoskeletal: Degenerative changes are seen in the spine. IMPRESSION: 1. Acute necrotizing pancreatitis with an approximately 3 cm area of non enhancement involving the pancreatic body. The degree of peripancreatic inflammatory changes is slightly increased since 11/20/2022. No definite acute necrotic collections or findings to suggest infected necrosis. 2. Moderate left pleural effusion with associated atelectasis. 3. Hepatic steatosis. Electronically Signed   By: Zerita Boers M.D.   On: 11/23/2022 13:15   US RENAL  Result Date: 11/20/2022 CLINICAL DATA:  AK I EXAM: RENAL / URINARY TRACT ULTRASOUND COMPLETE COMPARISON:  None Available. FINDINGS: Right Kidney: Renal measurements: 12.3 x 6.2 x 5.1 cm = volume: 203 mL. Echogenicity within normal limits. No mass or hydronephrosis visualized. Left Kidney: Renal measurements: 12.8 x 6.7 x 5.4 cm = volume: 241 mL. Echogenicity within normal limits. No mass or hydronephrosis visualized. Bladder: Appears normal for degree of bladder distention. Other: None. IMPRESSION: No significant sonographic abnormality of the kidneys. Electronically Signed   By: Marin Roberts M.D.   On: 11/20/2022 16:13   DG Chest 1 View  Result Date: 11/20/2022 CLINICAL DATA:  Vomiting and weakness.  Acute pancreatitis. EXAM: CHEST  1 VIEW COMPARISON:  CT scan abdomen/pelvis, same date. FINDINGS: The cardiac silhouette, mediastinal and hilar contours are within normal limits. Small left pleural effusion and bibasilar atelectasis. No infiltrates or pulmonary edema. No pulmonary lesions. The bony thorax is intact. IMPRESSION: Small left pleural effusion and bibasilar atelectasis. Electronically Signed   By: Marijo Sanes M.D.   On: 11/20/2022 13:46    CT ABDOMEN PELVIS WO CONTRAST  Result Date: 11/20/2022 CLINICAL DATA:  Acute severe abdominal pain vomiting since Saturday EXAM: CT ABDOMEN AND PELVIS WITHOUT CONTRAST TECHNIQUE: Multidetector CT imaging of the abdomen and pelvis was performed following the standard protocol without IV contrast. RADIATION DOSE REDUCTION: This exam was performed according to the departmental dose-optimization program which includes automated exposure control, adjustment of the mA and/or kV according to patient size and/or use of iterative reconstruction technique. COMPARISON:  CT examination dated May 26, 2020 FINDINGS: Lower chest: Small left pleural effusion with left basilar atelectasis. Hepatobiliary: No focal liver abnormality is seen. No gallstones, gallbladder wall thickening, or biliary dilatation. Pancreas: There is marked peripancreatic edema with low-attenuation of the pancreas suggesting acute severe pancreatitis no peripancreatic fluid collection or pseudocyst. Spleen: Normal in size without focal abnormality. Adrenals/Urinary Tract: Adrenal glands are unremarkable. Kidneys are normal, without renal calculi, focal lesion, or hydronephrosis. Bladder is unremarkable. Stomach/Bowel: Stomach is within normal limits. Appendix not identified, correlate with prior clinical history. No evidence of bowel wall thickening, distention, or inflammatory changes. Scattered colonic diverticulosis without evidence of acute diverticulitis. Vascular/Lymphatic: No significant vascular findings are present. No enlarged abdominal or pelvic lymph nodes. Reproductive: Prostate is unremarkable. Other: No abdominal wall hernia or abnormality. Small amount of fluid in the left paracolic gutter, likely reactive secondary to severe pancreatitis. Musculoskeletal: Multilevel degenerate disc disease with levoscoliosis centered at  L3 vertebral body. No acute osseous abnormality. IMPRESSION: 1. Marked peripancreatic edema with low-attenuation  of the pancreas consistent with acute severe pancreatitis. No peripancreatic fluid collection or pseudocyst. 2. Small left pleural effusion with left basilar atelectasis. 3. Scattered colonic diverticulosis without evidence of acute diverticulitis. 4. Multilevel degenerate disc disease with levoscoliosis centered at L3 vertebral body. Electronically Signed   By: Keane Police D.O.   On: 11/20/2022 13:43    Microbiology: Results for orders placed or performed during the hospital encounter of 11/20/22  Resp panel by RT-PCR (RSV, Flu A&B, Covid) Anterior Nasal Swab     Status: None   Collection Time: 11/20/22 12:01 PM   Specimen: Anterior Nasal Swab  Result Value Ref Range Status   SARS Coronavirus 2 by RT PCR NEGATIVE NEGATIVE Final    Comment: (NOTE) SARS-CoV-2 target nucleic acids are NOT DETECTED.  The SARS-CoV-2 RNA is generally detectable in upper respiratory specimens during the acute phase of infection. The lowest concentration of SARS-CoV-2 viral copies this assay can detect is 138 copies/mL. A negative result does not preclude SARS-Cov-2 infection and should not be used as the sole basis for treatment or other patient management decisions. A negative result may occur with  improper specimen collection/handling, submission of specimen other than nasopharyngeal swab, presence of viral mutation(s) within the areas targeted by this assay, and inadequate number of viral copies(<138 copies/mL). A negative result must be combined with clinical observations, patient history, and epidemiological information. The expected result is Negative.  Fact Sheet for Patients:  EntrepreneurPulse.com.au  Fact Sheet for Healthcare Providers:  IncredibleEmployment.be  This test is no t yet approved or cleared by the [place] FDA and  has been authorized for detection and/or diagnosis of SARS-CoV-2 by FDA under an Emergency Use Authorization (EUA). This EUA will  remain  in effect (meaning this test can be used) for the duration of the COVID-19 declaration under Section 564(b)(1) of the Act, 21 U.S.C.section 360bbb-3(b)(1), unless the authorization is terminated  or revoked sooner.       Influenza A by PCR NEGATIVE NEGATIVE Final   Influenza B by PCR NEGATIVE NEGATIVE Final    Comment: (NOTE) The Xpert Xpress SARS-CoV-2/FLU/RSV plus assay is intended as an aid in the diagnosis of influenza from Nasopharyngeal swab specimens and should not be used as a sole basis for treatment. Nasal washings and aspirates are unacceptable for Xpert Xpress SARS-CoV-2/FLU/RSV testing.  Fact Sheet for Patients: EntrepreneurPulse.com.au  Fact Sheet for Healthcare Providers: IncredibleEmployment.be  This test is not yet approved or cleared by the [place] FDA and has been authorized for detection and/or diagnosis of SARS-CoV-2 by FDA under an Emergency Use Authorization (EUA). This EUA will remain in effect (meaning this test can be used) for the duration of the COVID-19 declaration under Section 564(b)(1) of the Act, 21 U.S.C. section 360bbb-3(b)(1), unless the authorization is terminated or revoked.     Resp Syncytial Virus by PCR NEGATIVE NEGATIVE Final    Comment: (NOTE) Fact Sheet for Patients: EntrepreneurPulse.com.au  Fact Sheet for Healthcare Providers: IncredibleEmployment.be  This test is not yet approved or cleared by the [place] FDA and has been authorized for detection and/or diagnosis of SARS-CoV-2 by FDA under an Emergency Use Authorization (EUA). This EUA will remain in effect (meaning this test can be used) for the duration of the COVID-19 declaration under Section 564(b)(1) of the Act, 21 U.S.C. section 360bbb-3(b)(1), unless the authorization is terminated or revoked.  Performed at [facility], [phone]  [address] [place], [place] [address]    MRSA Next Gen by PCR, Nasal     Status: None   Collection Time: 11/26/22  2:03 PM   Specimen: Nasal Mucosa; Nasal Swab  Result Value Ref Range Status   MRSA by PCR Next Gen NOT DETECTED NOT DETECTED Final    Comment: (NOTE) The GeneXpert MRSA Assay (FDA approved for NASAL specimens only), is one component of a comprehensive MRSA colonization surveillance program. It is not intended to diagnose MRSA infection nor to guide or monitor treatment for MRSA infections. Test performance is not FDA approved in patients less than 65 years old. Performed at [facility], [address] [place], [place] [address]     Labs: CBC: Recent Labs  Lab 11/28/22 [phone] 11/29/22 0845 11/30/22 0316 12/01/22 0509 12/02/22 0602  WBC 20.2* 15.6* 16.3* 15.1* 16.2*  HGB 10.6* 10.7* 10.3* 10.0* 10.1*  HCT 30.9* 31.6* 30.7* 29.6* 30.5*  MCV 96.6 97.8 99.0 97.0 97.4  PLT 361 351 385 [id number]   Basic Metabolic Panel: Recent Labs  Lab 11/28/22 0435 11/28/22 0950 11/29/22 0845 11/29/22 1229 11/30/22 0316 11/30/22 0717 12/01/22 0509 12/01/22 1501 12/02/22 0602 12/02/22 1703  NA 126*   < > 130*   < > 133* 132* 125* 127* 125* 127*  K 3.7  --  3.4*  --  4.3  --  3.8 3.3* 3.3*  --   CL 98  --  101  --  95*  --  94* 96* 97*  --   CO2 23  --  21*  --  24  --  24 25 24   --   GLUCOSE 111*  --  87  --  98  --  98 96 88  --   BUN 15  --  17  --  16  --  13 12 9   --   CREATININE 0.72  --  0.63  --  0.72  --  0.62 0.61 0.66  --   CALCIUM 7.5*  --  7.8*  --  8.4*  --  7.7* 7.6* 7.6*  --   MG 1.9  --  1.7  --  1.6*  --  1.7  --  1.8  --   PHOS  --   --   --   --   --   --   --   --  3.9  --    < > = values in this interval not displayed.   Liver Function Tests: Recent Labs  Lab 12/01/22 0509 12/02/22 1703  AST 35 29  ALT 24 19  ALKPHOS 103 100  BILITOT 0.5 0.5  PROT 5.5* 5.8*  ALBUMIN 2.0* 2.1*   CBG: No results for input(s): "GLUCAP" in the last 168 hours.  Discharge time spent:  greater than 30 minutes.  Signed: Ezekiel Slocumb, DO [facility] 12/03/2022

## 2022-12-02 NOTE — Progress Notes (Signed)
[facility]  ROUNDING NOTE   Subjective:   Patient seen this morning, laying in the bed. States that he voided quite a bit with furosemide yesterday. Edema is improved but not resolved. Unfortunately sodium levels today has decreased to 125. He is anxious to return home and states that he feels well enough to go today.  Objective:  Vital signs in last 24 hours:  Temp:  [97.7 F (36.5 C)-98.6 F (37 C)] 98.4 F (36.9 C) (03/24 0812) Pulse Rate:  [97-102] 98 (03/24 0812) Resp:  [16-20] 16 (03/24 0812) BP: (140-162)/(87-96) 159/87 (03/24 0812) SpO2:  [96 %-99 %] 99 % (03/24 0812)  Weight change:  Filed Weights   11/26/22 1405 11/27/22 0500 12/01/22 0500  Weight: 103 kg 104.1 kg 104.1 kg    Intake/Output: I/O last 3 completed shifts: In: [id number] [P.O.:1040; I.V.:9] Out: 880 [Urine:880]   Intake/Output this shift:  Total I/O In: 180 [P.O.:180] Out: 200 [Urine:200]  Physical Exam: General: NAD  Head: Normocephalic, atraumatic. Moist oral mucosal membranes  Eyes: Anicteric  Lungs:  Clear to auscultation, normal effort  Heart: Regular rate and rhythm  Abdomen:  Firm, nontender, distended, tympanic  Extremities: ++ peripheral edema.  Neurologic: Alert and oriented, moving all four extremities  Skin: No lesions    Basic Metabolic Panel: Recent Labs  Lab 11/28/22 0435 11/28/22 0950 11/29/22 0845 11/29/22 1229 11/30/22 0316 11/30/22 0717 12/01/22 0509 12/01/22 1501 12/02/22 0602  NA 126*   < > 130*   < > 133* 132* 125* 127* 125*  K 3.7  --  3.4*  --  4.3  --  3.8 3.3* 3.3*  CL 98  --  101  --  95*  --  94* 96* 97*  CO2 23  --  21*  --  24  --  24 25 24   GLUCOSE 111*  --  87  --  98  --  98 96 88  BUN 15  --  17  --  16  --  13 12 9   CREATININE 0.72  --  0.63  --  0.72  --  0.62 0.61 0.66  CALCIUM 7.5*  --  7.8*  --  8.4*  --  7.7* 7.6* 7.6*  MG 1.9  --  1.7  --  1.6*  --  1.7  --  1.8  PHOS  --   --   --   --   --   --   --   --  3.9   < > =  values in this interval not displayed.     Liver Function Tests: Recent Labs  Lab 12/01/22 0509  AST 35  ALT 24  ALKPHOS 103  BILITOT 0.5  PROT 5.5*  ALBUMIN 2.0*    Recent Labs  Lab 11/30/22 0316  LIPASE 27    No results for input(s): "AMMONIA" in the last 168 hours.  CBC: Recent Labs  Lab 11/28/22 0435 11/29/22 0845 11/30/22 0316 12/01/22 0509 12/02/22 0602  WBC 20.2* 15.6* 16.3* 15.1* 16.2*  HGB 10.6* 10.7* 10.3* 10.0* 10.1*  HCT 30.9* 31.6* 30.7* 29.6* 30.5*  MCV 96.6 97.8 99.0 97.0 97.4  PLT 361 351 385 360 346     Cardiac Enzymes: No results for input(s): "CKTOTAL", "CKMB", "CKMBINDEX", "TROPONINI" in the last 168 hours.  BNP: Invalid input(s): "POCBNP"  CBG: No results for input(s): "GLUCAP" in the last 168 hours.   Microbiology: Results for orders placed or performed during the hospital encounter of 11/20/22  Resp  panel by RT-PCR (RSV, Flu A&B, Covid) Anterior Nasal Swab     Status: None   Collection Time: 11/20/22 12:01 PM   Specimen: Anterior Nasal Swab  Result Value Ref Range Status   SARS Coronavirus 2 by RT PCR NEGATIVE NEGATIVE Final    Comment: (NOTE) SARS-CoV-2 target nucleic acids are NOT DETECTED.  The SARS-CoV-2 RNA is generally detectable in upper respiratory specimens during the acute phase of infection. The lowest concentration of SARS-CoV-2 viral copies this assay can detect is 138 copies/mL. A negative result does not preclude SARS-Cov-2 infection and should not be used as the sole basis for treatment or other patient management decisions. A negative result may occur with  improper specimen collection/handling, submission of specimen other than nasopharyngeal swab, presence of viral mutation(s) within the areas targeted by this assay, and inadequate number of viral copies(<138 copies/mL). A negative result must be combined with clinical observations, patient history, and epidemiological information. The expected result is  Negative.  Fact Sheet for Patients:  EntrepreneurPulse.com.au  Fact Sheet for Healthcare Providers:  IncredibleEmployment.be  This test is no t yet approved or cleared by the [place] FDA and  has been authorized for detection and/or diagnosis of SARS-CoV-2 by FDA under an Emergency Use Authorization (EUA). This EUA will remain  in effect (meaning this test can be used) for the duration of the COVID-19 declaration under Section 564(b)(1) of the Act, 21 U.S.C.section 360bbb-3(b)(1), unless the authorization is terminated  or revoked sooner.       Influenza A by PCR NEGATIVE NEGATIVE Final   Influenza B by PCR NEGATIVE NEGATIVE Final    Comment: (NOTE) The Xpert Xpress SARS-CoV-2/FLU/RSV plus assay is intended as an aid in the diagnosis of influenza from Nasopharyngeal swab specimens and should not be used as a sole basis for treatment. Nasal washings and aspirates are unacceptable for Xpert Xpress SARS-CoV-2/FLU/RSV testing.  Fact Sheet for Patients: EntrepreneurPulse.com.au  Fact Sheet for Healthcare Providers: IncredibleEmployment.be  This test is not yet approved or cleared by the [place] FDA and has been authorized for detection and/or diagnosis of SARS-CoV-2 by FDA under an Emergency Use Authorization (EUA). This EUA will remain in effect (meaning this test can be used) for the duration of the COVID-19 declaration under Section 564(b)(1) of the Act, 21 U.S.C. section 360bbb-3(b)(1), unless the authorization is terminated or revoked.     Resp Syncytial Virus by PCR NEGATIVE NEGATIVE Final    Comment: (NOTE) Fact Sheet for Patients: EntrepreneurPulse.com.au  Fact Sheet for Healthcare Providers: IncredibleEmployment.be  This test is not yet approved or cleared by the [place] FDA and has been authorized for detection and/or diagnosis of  SARS-CoV-2 by FDA under an Emergency Use Authorization (EUA). This EUA will remain in effect (meaning this test can be used) for the duration of the COVID-19 declaration under Section 564(b)(1) of the Act, 21 U.S.C. section 360bbb-3(b)(1), unless the authorization is terminated or revoked.  Performed at [facility], [address] [place], [place] [address]   MRSA Next Gen by PCR, Nasal     Status: None   Collection Time: 11/26/22  2:03 PM   Specimen: Nasal Mucosa; Nasal Swab  Result Value Ref Range Status   MRSA by PCR Next Gen NOT DETECTED NOT DETECTED Final    Comment: (NOTE) The GeneXpert MRSA Assay (FDA approved for NASAL specimens only), is one component of a comprehensive MRSA colonization surveillance program. It is not intended to diagnose MRSA infection nor to guide or  monitor treatment for MRSA infections. Test performance is not FDA approved in patients less than 61 years old. Performed at [facility], [address] [place], [place] [address]     Coagulation Studies: No results for input(s): "LABPROT", "INR" in the last 72 hours.  Urinalysis: No results for input(s): "COLORURINE", "LABSPEC", "PHURINE", "GLUCOSEU", "HGBUR", "BILIRUBINUR", "KETONESUR", "PROTEINUR", "UROBILINOGEN", "NITRITE", "LEUKOCYTESUR" in the last 72 hours.  Invalid input(s): "APPERANCEUR"     Imaging: CT ABDOMEN PELVIS W CONTRAST  Result Date: 11/30/2022 CLINICAL DATA:  Pancreatitis. EXAM: CT ABDOMEN AND PELVIS WITH CONTRAST TECHNIQUE: Multidetector CT imaging of the abdomen and pelvis was performed using the standard protocol following bolus administration of intravenous contrast. RADIATION DOSE REDUCTION: This exam was performed according to the departmental dose-optimization program which includes automated exposure control, adjustment of the mA and/or kV according to patient size and/or use of iterative reconstruction technique. CONTRAST:  159mL OMNIPAQUE IOHEXOL 300  MG/ML  SOLN COMPARISON:  CT 11/23/2022 FINDINGS: Lower chest: New tiny right pleural effusion. Stable small left. Adjacent atelectasis. Breathing motion. New small pericardial effusion. Hepatobiliary: Fatty liver infiltration. No space-occupying liver lesion. Gallbladder is present. Patent portal vein. Pancreas: Series decreased pancreatic edema enlargement. Along the neck of the pancreas is an area of heterogeneous enhancement, similar to previous. No gas. Previously there is confluence fluid and edema surrounding the pancreas extending of the mesentery which was severe. Distribution appears similar today but the areas are more confluence and have more defined margins. Some of the areas along the central mesentery are somewhat nodular in appearance and are increasing. Fluid tracks along the anterior perinephric space on the left and left pericolic gutter. No rim enhancing fluid collections at this time. Of note the splenic vein is patent but small. Spleen: Spleen is nonenlarged.  Preserved enhancement. Adrenals/Urinary Tract: Adrenal glands are preserved. Enhancing renal mass or collecting system dilatation. The ureters have normal course and caliber extending down to the bladder. Bladder is underdistended. Stomach/Bowel: Large bowel on this exam has a normal course and caliber. Few left-sided colonic diverticula. There are some air-filled distended loops along the transverse colon and fluid along the right side of the colon stomach has some luminal debris and oral contrast. Duodenal is nondilated. Of note there is fluid tracking into the porta hepatis and along the second portion of the duodenal in the pancreatic groove. Small-bowel has some loops that now measure 4.4 cm in diameter. This is new from previous. No abrupt transition. This could represent an ileus rather than a developing obstruction. Simple attention on follow-up. Vascular/Lymphatic: Normal caliber aorta and IVC with some mild atherosclerotic  plaque. No definite abnormal lymph node enlargement identified in the abdomen and pelvis. Reproductive: Prostate is unremarkable. Other: Anasarca is increasing. No free air. Trace free fluid in the pelvis and some edema in the presacral space, nonspecific. Surgical mesh along the anterior pelvic wall. Musculoskeletal: Curvature of the spine with some degenerative change. IMPRESSION: Evolving pancreatic edema with some residual asymmetric areas along the pancreatic neck. Recommend continued follow-up to assess for evolution of pancreatic necrosis. Increasing overall mesenteric stranding and fluid. The areas of fluid are now becoming more confluence with defined margins consistent with the evolution of pancreatitis. Acute peripancreatic fluid collections. Recommend continued surveillance. No true pseudocyst formation at this time. No soft tissue gas. Mild scattered ascites. Developing of moderately dilated loops of small bowel in the midabdomen but no abrupt transition. Favor ileus than obstruction. Recommend attention on follow-up. Persistent small left effusion.  New  tiny right. Increasing small pericardial effusion. Fatty liver infiltration. Patent portal and splenic veins. The splenic vein is small in caliber. Electronically Signed   By: Jill Side M.D.   On: 11/30/2022 16:38     Medications:      calcium carbonate  1 tablet Oral TID WC   Chlorhexidine Gluconate Cloth  6 each Topical Daily   diclofenac Sodium  4 g Topical QID   enoxaparin (LOVENOX) injection  0.5 mg/kg Subcutaneous Q24H   gabapentin  300 mg Oral TID   hydrALAZINE  100 mg Oral Q8H   hydrocortisone  25 mg Rectal BID   irbesartan  75 mg Oral Daily   PARoxetine  30 mg Oral Daily   sodium chloride flush  3 mL Intravenous Q12H   acetaminophen **OR** acetaminophen, ALPRAZolam, fluticasone, hydrALAZINE, HYDROcodone-acetaminophen, ondansetron **OR** ondansetron (ZOFRAN) IV, polyethylene glycol  Assessment/ Plan:  Arthur Patterson  is a 62 y.o.  male with past medical continues including hyperlipidemia, GERD, hypertension, lumbar disc disease, and diverticulosis, who was admitted to [facility] on 11/20/2022 for Hypocalcemia [E83.51] Nausea [R11.0] Acute renal failure ([place]) [N17.9] AKI (acute kidney injury) ([place]) [N17.9]    Acute kidney injury likely secondary to severe illness and hypovolemia.  Normal renal function noted on previous labs.  No IV contrast exposure.  Creatinine on ED arrival 7.82 with GFR 7.  Renal ultrasound negative for obstruction.   Creatinine is now back to baseline.  Lab Results  Component Value Date   CREATININE 0.66 12/02/2022   CREATININE 0.61 12/01/2022   CREATININE 0.62 12/01/2022    Intake/Output Summary (Last 24 hours) at 12/02/2022 1130 Last data filed at 12/02/2022 1025 Gross per 24 hour  Intake 1226 ml  Output 880 ml  Net 346 ml    2.  Severe hyponatremia, sodium on admission 113.   3% hypertonic saline started on 11/26/2022 due to persistent hyponatremia.   Hyponatremia appears to be hypervolemic at present but there may be a component of SIADH underlying.  Sodium level did not improve significantly with furosemide.  Patient still has volume overload and significant edema.  Plan to try tolvaptan today and see if that helps improve the sodium/volume level. If sodium improves to close to 130 this afternoon and patient is clinically doing well, okay to discharge home from renal standpoint with outpatient follow-up.  3.  Hypertension, essential.  Home regimen currently telmisartan-hydrochlorothiazide.  Managed with hydralazine and irbesartan at present.  4.  Acute pancreatitis Symptomatically improving Serial abdominal imaging for monitoring.    LOS: [place] 3/24/202411:30 AM

## 2022-12-02 NOTE — Progress Notes (Signed)
Arthur Patterson notified of sodium level, this RN notified by physician that she was going to place a discharge order. Discharge order placed, education and discharge materials provided to patient, all questions and concerns addressed at this time. Iv's removed with no complications. All belongings gathered with patient, wife and daughter. Patient escorted off unit by member of unit staff to personal vehicle with wife and daughter and all of his belongings

## 2022-12-03 ENCOUNTER — Encounter: Payer: Self-pay | Admitting: Internal Medicine

## 2022-12-12 ENCOUNTER — Other Ambulatory Visit: Payer: Self-pay | Admitting: Internal Medicine

## 2022-12-12 DIAGNOSIS — K8500 Idiopathic acute pancreatitis without necrosis or infection: Secondary | ICD-10-CM

## 2022-12-12 DIAGNOSIS — N170 Acute kidney failure with tubular necrosis: Secondary | ICD-10-CM

## 2022-12-24 ENCOUNTER — Ambulatory Visit
Admission: RE | Admit: 2022-12-24 | Discharge: 2022-12-24 | Disposition: A | Discharge status: Home / Self Care | Payer: 59 | Source: Ambulatory Visit | Attending: Internal Medicine | Admitting: Internal Medicine

## 2022-12-24 DIAGNOSIS — K8500 Idiopathic acute pancreatitis without necrosis or infection: Secondary | ICD-10-CM

## 2022-12-24 DIAGNOSIS — N170 Acute kidney failure with tubular necrosis: Secondary | ICD-10-CM

## 2022-12-24 MED ORDER — GADOPICLENOL 0.5 MMOL/ML IV SOLN
10.0000 mL | Freq: Once | INTRAVENOUS | Status: AC | PRN
  Administered 2022-12-24: 10 mL via INTRAVENOUS

## 2023-01-10 ENCOUNTER — Other Ambulatory Visit: Payer: Self-pay | Admitting: Oncology

## 2023-01-10 DIAGNOSIS — K8591 Acute pancreatitis with uninfected necrosis, unspecified: Secondary | ICD-10-CM

## 2023-01-10 DIAGNOSIS — K863 Pseudocyst of pancreas: Secondary | ICD-10-CM

## 2023-01-14 ENCOUNTER — Ambulatory Visit
Admission: RE | Admit: 2023-01-14 | Discharge: 2023-01-14 | Disposition: A | Discharge status: Home / Self Care | Payer: 59 | Source: Ambulatory Visit | Attending: Oncology | Admitting: Oncology

## 2023-01-14 DIAGNOSIS — K8591 Acute pancreatitis with uninfected necrosis, unspecified: Secondary | ICD-10-CM | POA: Diagnosis present

## 2023-01-14 DIAGNOSIS — K863 Pseudocyst of pancreas: Secondary | ICD-10-CM | POA: Insufficient documentation

## 2023-05-12 DEATH — deceased
